# Patient Record
Sex: Female | Born: 1995
Health system: Southern US, Community
[De-identification: ages and names within clinical notes are randomized; demographics above are authoritative.]

## PROBLEM LIST (undated history)

## (undated) DIAGNOSIS — N83209 Unspecified ovarian cyst, unspecified side: Secondary | ICD-10-CM

## (undated) DIAGNOSIS — J45909 Unspecified asthma, uncomplicated: Secondary | ICD-10-CM

## (undated) HISTORY — PX: TUBAL LIGATION: SHX77

---
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
## 2016-02-24 ENCOUNTER — Ambulatory Visit (HOSPITAL_COMMUNITY)
Admission: EM | Admit: 2016-02-24 | Discharge: 2016-02-24 | Disposition: A | Discharge status: Home / Self Care | Payer: Medicaid Other | Attending: Emergency Medicine | Admitting: Emergency Medicine

## 2016-02-24 ENCOUNTER — Encounter (HOSPITAL_COMMUNITY): Payer: Self-pay | Admitting: Emergency Medicine

## 2016-02-24 DIAGNOSIS — R350 Frequency of micturition: Secondary | ICD-10-CM

## 2016-02-24 DIAGNOSIS — J4520 Mild intermittent asthma, uncomplicated: Secondary | ICD-10-CM

## 2016-02-24 DIAGNOSIS — L253 Unspecified contact dermatitis due to other chemical products: Secondary | ICD-10-CM | POA: Diagnosis not present

## 2016-02-24 DIAGNOSIS — J069 Acute upper respiratory infection, unspecified: Secondary | ICD-10-CM

## 2016-02-24 DIAGNOSIS — R112 Nausea with vomiting, unspecified: Secondary | ICD-10-CM

## 2016-02-24 LAB — POCT URINALYSIS DIP (DEVICE)
Bilirubin Urine: NEGATIVE
Glucose, UA: NEGATIVE mg/dL
Hgb urine dipstick: NEGATIVE
Ketones, ur: NEGATIVE mg/dL
Leukocytes, UA: NEGATIVE
Nitrite: NEGATIVE
Protein, ur: NEGATIVE mg/dL
Specific Gravity, Urine: 1.025 (ref 1.005–1.030)
Urobilinogen, UA: 0.2 mg/dL (ref 0.0–1.0)
pH: 5.5 (ref 5.0–8.0)

## 2016-02-24 MED ORDER — ONDANSETRON HCL 4 MG PO TABS: 4.0000 mg | ORAL_TABLET | Freq: Four times a day (QID) | ORAL | 0 refills | Status: DC

## 2016-02-24 MED ORDER — ALBUTEROL SULFATE HFA 108 (90 BASE) MCG/ACT IN AERS: 2.0000 | INHALATION_SPRAY | RESPIRATORY_TRACT | 0 refills | Status: DC | PRN

## 2016-02-24 MED ORDER — TRIAMCINOLONE ACETONIDE 0.1 % EX CREA: 1.0000 "application " | TOPICAL_CREAM | Freq: Two times a day (BID) | CUTANEOUS | 0 refills | Status: DC

## 2016-02-24 NOTE — Discharge Instructions (Signed)
Read the instructions above regarding care for your medical concerns today. Recommend taking Sudafed PE 10 mg every 4 hours for congestion and Allegra daily for drainage. Apply the triamcinolone cream to your thumb twice a day and wear close when cleaning. Your urinalysis was normal, no signs of infection blood or sugar in your urine. You may need to follow-up with primary care provider if this persists. During the time of nausea and vomiting recommended clear liquids in small frequent amounts and slowly advance diet as tolerated. Your provided a medicine called Zofran to help with nausea and vomiting.

## 2016-02-24 NOTE — ED Triage Notes (Signed)
[facility] a complaint of N/V x 3 days. The patient stated that her job requested she come and be evaluated and provided a work note to return to work.

## 2016-02-24 NOTE — ED Provider Notes (Signed)
CSN: [id number]     Arrival date & time 02/24/16  1449 History   First MD Initiated Contact with Patient 02/24/16 1629     Chief Complaint  Patient presents with  . Emesis   (Consider location/radiation/quality/duration/timing/severity/associated sxs/prior Treatment) 20 year old female presents to the urgent care with multiple complaints. She starts out by stating that she was sent to the urgent care by her employer because she has vomited twice today. Denies abdominal pain. Does have nausea and decreased appetite. In addition she has upper respiratory congestion, runny nose, PND, sore throat, headache. She states that 4 days ago she had a fever for one day but that seemed to dissipated and did not return. She is complaining of a rash to the thumb for one week it itches and burns. 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 History of asthma but she has not had it evaluated recently. She believes that she may be having an exacerbation but does not have medications are inhalers for treatment.        Past Medical History:  Diagnosis Date  . Asthma   . Ovarian cyst    Past Surgical History:  Procedure Laterality Date  . TUBAL LIGATION Right    To fix an ovarian cyst   History reviewed. No pertinent family history. Social History  Substance Use Topics  . Smoking status: Never Smoker  . Smokeless tobacco: Never Used  . Alcohol use No   OB History    No data available     Review of Systems  Constitutional: Negative.  Negative for activity change, appetite change, chills, fatigue and fever.  HENT: Positive for congestion, postnasal drip, rhinorrhea and sore throat. Negative for facial swelling.   Eyes: Negative.   Respiratory: Positive for  cough.   Cardiovascular: Negative.  Negative for chest pain and palpitations.  Gastrointestinal: Positive for nausea and vomiting.  Genitourinary: Positive for frequency.  Musculoskeletal: Negative.  Negative for neck pain and neck stiffness.  Skin: Negative for pallor and rash.       Redness to right thumb as per history of present illness.  Neurological: Negative.   All other systems reviewed and are negative.   Allergies  Patient has no known allergies.  Home Medications   Prior to Admission medications   Medication Sig Start Date End Date Taking? Authorizing Provider  acetaminophen (TYLENOL) 325 MG tablet Take 650 mg by mouth every 6 (six) hours as needed for mild pain or moderate pain.   Yes Historical Provider, MD  ondansetron (ZOFRAN) 4 MG tablet Take 1 tablet (4 mg total) by mouth every 6 (six) hours. 02/24/16   Hayden Rasmussenavid Jull Harral, NP  triamcinolone cream (KENALOG) 0.1 % Apply 1 application topically 2 (two) times daily. 02/24/16   Hayden Rasmussenavid Timithy Arons, NP   Meds Ordered and Administered this Visit  Medications - No data to display  BP (!) 148/54 (BP Location: Left Arm)   Pulse 84   Temp 98.2 F (36.8 C) (Oral)   Resp 18   SpO2 100%  No data found.   Physical Exam  Constitutional: She is oriented to person, place, and time. She appears well-developed and well-nourished. No distress.  HENT:  Right Ear: External ear normal.  Left Ear: External ear  normal.  Mouth/Throat: No oropharyngeal exudate.  Oropharynx with much cobblestoning, minor erythema and mild clear PND. No exudates or swelling. Left palatine tonsil slightly larger than left but otherwise both are relatively small.  Bilateral TMs are normal.  Eyes: EOM are normal.  Neck: Normal range of motion. Neck supple.  Cardiovascular: Normal rate, regular rhythm and normal heart sounds.   Pulmonary/Chest: Effort normal and breath sounds normal. No respiratory distress.  Normal inspiration. Diminished sounds on expiration. No  wheezes auscultated.  Abdominal: Soft. There is no tenderness.  Musculoskeletal: Normal range of motion. She exhibits no edema.  Lymphadenopathy:    She has no cervical adenopathy.  Neurological: She is alert and oriented to person, place, and time.  Skin: Skin is warm and dry.  There is a small area of  erythema to the extensor surface at the base of the right thumb. Evidence of scratching. No other evidence of erythema or areas that are affected. No other areas of erythema, recent scratching papules, vesicles or other lesions.   Nursing note and vitals reviewed.   Urgent Care Course   Clinical Course     Procedures (including critical care time)  Labs Review Labs Reviewed  POCT URINALYSIS DIP (DEVICE)    Imaging Review No results found.   Visual Acuity Review  Right Eye Distance:   Left Eye Distance:   Bilateral Distance:    Right Eye Near:   Left Eye Near:    Bilateral Near:         MDM   1. Non-intractable vomiting with nausea, unspecified vomiting type   2. Chemical dermatitis   3. Urinary frequency   4. Upper respiratory tract infection, unspecified type    Read the instructions above regarding care for your medical concerns today. Recommend taking Sudafed PE 10 mg every 4 hours for congestion and Allegra daily for drainage. Apply the triamcinolone cream to your thumb twice a day and wear close when cleaning. Your urinalysis was normal, no signs of infection blood or sugar in your urine. You may need to follow-up with primary care provider if this persists. During the time of nausea and vomiting recommended clear liquids in small frequent amounts and slowly advance diet as tolerated. Your provided a medicine called Zofran to help with nausea and vomiting. Meds ordered this encounter  Medications  . ondansetron (ZOFRAN) 4 MG tablet    Sig: Take 1 tablet (4 mg total) by mouth every 6 (six) hours.    Dispense:  12 tablet    Refill:  0    Order Specific  Question:   Supervising Provider    Answer:   Charm RingsHONIG, ERIN J [id number]]  . triamcinolone cream (KENALOG) 0.1 %    Sig: Apply 1 application topically 2 (two) times daily.    Dispense:  15 g    Refill:  0    Order Specific Question:   Supervising Provider    Answer:   Charm RingsHONIG, ERIN J [id number]]  . albuterol (PROVENTIL HFA;VENTOLIN HFA) 108 (90 Base) MCG/ACT inhaler    Sig: Inhale 2 puffs into the lungs every 4 (four) hours as needed for wheezing or shortness of breath.    Dispense:  1 Inhaler    Refill:  0    Order Specific Question:   Supervising Provider    Answer:   Charm RingsHONIG, ERIN J [id number]]       Hayden Rasmussenavid Cassundra Mckeever, NP 02/24/16 1726    Hayden Rasmussenavid Ileane Sando, NP 02/24/16 1730

## 2016-04-29 ENCOUNTER — Encounter (HOSPITAL_COMMUNITY): Payer: Self-pay | Admitting: Emergency Medicine

## 2016-04-29 ENCOUNTER — Emergency Department (HOSPITAL_COMMUNITY)
Admission: EM | Admit: 2016-04-29 | Discharge: 2016-04-29 | Disposition: A | Discharge status: Home / Self Care | Payer: Medicaid Other | Attending: Emergency Medicine | Admitting: Emergency Medicine

## 2016-04-29 DIAGNOSIS — J45909 Unspecified asthma, uncomplicated: Secondary | ICD-10-CM | POA: Diagnosis not present

## 2016-04-29 DIAGNOSIS — Z79899 Other long term (current) drug therapy: Secondary | ICD-10-CM | POA: Diagnosis not present

## 2016-04-29 DIAGNOSIS — T7840XA Allergy, unspecified, initial encounter: Secondary | ICD-10-CM | POA: Diagnosis not present

## 2016-04-29 LAB — CBC WITH DIFFERENTIAL/PLATELET
Basophils Absolute: 0 10*3/uL (ref 0.0–0.1)
Basophils Relative: 1 %
Eosinophils Absolute: 0 10*3/uL (ref 0.0–0.7)
Eosinophils Relative: 0 %
HCT: 35.4 % — ABNORMAL LOW (ref 36.0–46.0)
Hemoglobin: 11.6 g/dL — ABNORMAL LOW (ref 12.0–15.0)
Lymphocytes Relative: 31 %
Lymphs Abs: 1.6 10*3/uL (ref 0.7–4.0)
MCH: 26.2 pg (ref 26.0–34.0)
MCHC: 32.8 g/dL (ref 30.0–36.0)
MCV: 80.1 fL (ref 78.0–100.0)
Monocytes Absolute: 0.8 10*3/uL (ref 0.1–1.0)
Monocytes Relative: 16 %
Neutro Abs: 2.7 10*3/uL (ref 1.7–7.7)
Neutrophils Relative %: 52 %
Platelets: 249 10*3/uL (ref 150–400)
RBC: 4.42 MIL/uL (ref 3.87–5.11)
RDW: 12.8 % (ref 11.5–15.5)
WBC: 5.1 10*3/uL (ref 4.0–10.5)

## 2016-04-29 LAB — BASIC METABOLIC PANEL
Anion gap: 8 (ref 5–15)
BUN: 11 mg/dL (ref 6–20)
CO2: 23 mmol/L (ref 22–32)
Calcium: 8.5 mg/dL — ABNORMAL LOW (ref 8.9–10.3)
Chloride: 105 mmol/L (ref 101–111)
Creatinine, Ser: 0.74 mg/dL (ref 0.44–1.00)
GFR calc Af Amer: >60 mL/min (ref >60)
GFR calc non Af Amer: >60 mL/min (ref >60)
Glucose, Bld: 86 mg/dL (ref 65–99)
Potassium: 3.4 mmol/L — ABNORMAL LOW (ref 3.5–5.1)
Sodium: 136 mmol/L (ref 135–145)

## 2016-04-29 MED ORDER — MAGIC MOUTHWASH
10.0000 mL | Freq: Once | ORAL | Status: AC
  Administered 2016-04-29: 10 mL via ORAL
  Filled 2016-04-29: qty 10

## 2016-04-29 MED ORDER — PREDNISONE 20 MG PO TABS: 40.0000 mg | ORAL_TABLET | Freq: Every day | ORAL | 0 refills | Status: AC

## 2016-04-29 MED ORDER — METHYLPREDNISOLONE SODIUM SUCC 125 MG IJ SOLR
125.0000 mg | Freq: Once | INTRAMUSCULAR | Status: AC
  Administered 2016-04-29: 125 mg via INTRAVENOUS
  Filled 2016-04-29: qty 2

## 2016-04-29 MED ORDER — RANITIDINE HCL 150 MG PO CAPS: 150.0000 mg | ORAL_CAPSULE | Freq: Every day | ORAL | 0 refills | Status: DC

## 2016-04-29 MED ORDER — DIPHENHYDRAMINE HCL 50 MG/ML IJ SOLN
25.0000 mg | Freq: Once | INTRAMUSCULAR | Status: AC
  Administered 2016-04-29: 25 mg via INTRAVENOUS
  Filled 2016-04-29: qty 1

## 2016-04-29 MED ORDER — DIPHENHYDRAMINE HCL 25 MG PO TABS: 25.0000 mg | ORAL_TABLET | Freq: Four times a day (QID) | ORAL | 0 refills | Status: DC

## 2016-04-29 NOTE — ED Triage Notes (Signed)
Pt c/o throat, gingiva, and tongue swelling, dysphagia, SOB onset Monday, slowly progressing, no sudden progression of symptoms. Did use different brand of mouth wash, Equate brand. In control of oral secretions, no acute distress.

## 2016-04-29 NOTE — ED Provider Notes (Signed)
[facility]-EMERGENCY DEPT Provider Note   CSN: [id number] Arrival date & time: 04/29/16  1117     History   Chief Complaint Chief Complaint  Patient presents with  . Oral Swelling  . Shortness of Breath    HPI Ariel Lutz is a 21 y.o. female.  HPI   21 year old female with no sniffing a past medical history presents with tongue and lip swelling for the past 3 days. Patient states that 3 days ago, she used a new generic brand mouthwash. Since then, she has had persistent lip and tongue swelling. She feels a fullness in her mouth but denies significant difficulty breathing. She has been able to swallow. No fevers. She has had associated tingling and burning of her lips. No history of previous reactions. No rash. No other new lipstick or foods. No other new allergen exposures. No dental pain. No fevers or chills. No neck pain or stiffness.  Past Medical History:  Diagnosis Date  . Asthma   . Ovarian cyst     There are no active problems to display for this patient.   Past Surgical History:  Procedure Laterality Date  . TUBAL LIGATION Right    To fix an ovarian cyst    OB History    No data available       Home Medications    Prior to Admission medications   Medication Sig Start Date End Date Taking? Authorizing Provider  albuterol (PROVENTIL HFA;VENTOLIN HFA) 108 (90 Base) MCG/ACT inhaler Inhale 2 puffs into the lungs every 4 (four) hours as needed for wheezing or shortness of breath. 02/24/16  Yes Hayden Rasmussen, NP  ibuprofen (ADVIL,MOTRIN) 200 MG tablet Take 200-400 mg by mouth every 6 (six) hours as needed for headache or mild pain.   Yes Historical Provider, MD  acetaminophen (TYLENOL) 325 MG tablet Take 650 mg by mouth every 6 (six) hours as needed for mild pain or moderate pain.    Historical Provider, MD  diphenhydrAMINE (BENADRYL) 25 MG tablet Take 1 tablet (25 mg total) by mouth every 6 (six) hours. 04/29/16 05/04/16  Shaune Pollack, MD  ondansetron (ZOFRAN) 4 MG tablet  Take 1 tablet (4 mg total) by mouth every 6 (six) hours. Patient not taking: Reported on 04/29/2016 02/24/16   Hayden Rasmussen, NP  predniSONE (DELTASONE) 20 MG tablet Take 2 tablets (40 mg total) by mouth daily. 04/29/16 05/04/16  Shaune Pollack, MD  ranitidine (ZANTAC) 150 MG capsule Take 1 capsule (150 mg total) by mouth daily. 04/29/16 05/04/16  Shaune Pollack, MD  triamcinolone cream (KENALOG) 0.1 % Apply 1 application topically 2 (two) times daily. Patient not taking: Reported on 04/29/2016 02/24/16   Hayden Rasmussen, NP    Family History History reviewed. No pertinent family history.  Social History Social History  Substance Use Topics  . Smoking status: Never Smoker  . Smokeless tobacco: Never Used  . Alcohol use No     Allergies   Patient has no known allergies.   Review of Systems Review of Systems  Constitutional: Positive for fatigue. Negative for fever.  HENT: Positive for dental problem and facial swelling.   Respiratory: Negative for cough and shortness of breath.   Cardiovascular: Negative for chest pain.  Gastrointestinal: Negative for abdominal pain and diarrhea.  Genitourinary: Negative for dysuria and flank pain.  Musculoskeletal: Negative for neck pain.  Skin: Negative for rash.  Neurological: Negative for weakness and headaches.     Physical Exam Updated Vital Signs BP 128/67 (BP Location: Left Arm)  Pulse 92   Temp 98.4 F (36.9 C) (Oral)   Resp 16   SpO2 100%   Physical Exam  Constitutional: She is oriented to person, place, and time. She appears well-developed and well-nourished. No distress.  HENT:  Head: Normocephalic and atraumatic.  Mild subjective swelling of the lips and tongue. Oropharynx is otherwise widely patent with Mallampati class II. There is moderate gingival erythema diffusely without significant hyperplasia. No active dental caries.  Eyes: Conjunctivae are normal.  Neck: Neck supple.  Cardiovascular: Normal rate, regular rhythm and normal  heart sounds.  Exam reveals no friction rub.   No murmur heard. Pulmonary/Chest: Effort normal and breath sounds normal. No respiratory distress. She has no wheezes. She has no rales.  Abdominal: She exhibits no distension.  Musculoskeletal: She exhibits no edema.  Neurological: She is alert and oriented to person, place, and time. She exhibits normal muscle tone.  Skin: Skin is warm. Capillary refill takes less than 2 seconds.  Psychiatric: She has a normal mood and affect.  Nursing note and vitals reviewed.    ED Treatments / Results  Labs (all labs ordered are listed, but only abnormal results are displayed) Labs Reviewed  CBC WITH DIFFERENTIAL/PLATELET - Abnormal; Notable for the following:       Result Value   Hemoglobin 11.6 (*)    HCT 35.4 (*)    All other components within normal limits  BASIC METABOLIC PANEL - Abnormal; Notable for the following:    Potassium 3.4 (*)    Calcium 8.5 (*)    All other components within normal limits    EKG  EKG Interpretation None       Radiology No results found.  Procedures Procedures (including critical care time)  Medications Ordered in ED Medications  diphenhydrAMINE (BENADRYL) injection 25 mg (25 mg Intravenous Given 04/29/16 1153)  methylPREDNISolone sodium succinate (SOLU-MEDROL) 125 mg/2 mL injection 125 mg (125 mg Intravenous Given 04/29/16 1153)  magic mouthwash (10 mLs Oral Given 04/29/16 1328)     Initial Impression / Assessment and Plan / ED Course  I have reviewed the triage vital signs and the nursing notes.  Pertinent labs & imaging results that were available during my care of the patient were reviewed by me and considered in my medical decision making (see chart for details).    21 yo F with PMHx as above here with mild, subjective lip/gingival swelling after using mouthwash 2-3 days ago. No rash, wheezing, abdominal pain, or other sx of anaphylaxis. Suspect mild irritant versus allergic mucositis 2/2 mouthwash  use. No signs of airway compromise. Labs unremarkable. Sx stable and improving after treatment in ED. Will advise her to stop use of her mouthwash, d/c with outpt f/u. No evidence of dental infection or abscess. No sublingual swelling or signs of Ludwig's or deep neck infection.  Final Clinical Impressions(s) / ED Diagnoses   Final diagnoses:  Allergic reaction, initial encounter    New Prescriptions Discharge Medication List as of 04/29/2016  1:54 PM    START taking these medications   Details  diphenhydrAMINE (BENADRYL) 25 MG tablet Take 1 tablet (25 mg total) by mouth every 6 (six) hours., Starting Thu 04/29/2016, Until Tue 05/04/2016, Print    predniSONE (DELTASONE) 20 MG tablet Take 2 tablets (40 mg total) by mouth daily., Starting Thu 04/29/2016, Until Tue 05/04/2016, Print    ranitidine (ZANTAC) 150 MG capsule Take 1 capsule (150 mg total) by mouth daily., Starting Thu 04/29/2016, Until Tue 05/04/2016, Print  Shaune Pollackameron Ajanae Virag, MD 04/29/16 [phone]

## 2016-04-29 NOTE — ED Notes (Signed)
RN unable to get blood with IV stick. Will let phlebotomy know about drawing specimens.

## 2016-04-29 NOTE — ED Notes (Signed)
ED Provider at bedside. 

## 2016-10-18 ENCOUNTER — Encounter (HOSPITAL_COMMUNITY): Payer: Self-pay | Admitting: Emergency Medicine

## 2016-10-18 DIAGNOSIS — S20469A Insect bite (nonvenomous) of unspecified back wall of thorax, initial encounter: Secondary | ICD-10-CM | POA: Diagnosis not present

## 2016-10-18 DIAGNOSIS — W57XXXA Bitten or stung by nonvenomous insect and other nonvenomous arthropods, initial encounter: Secondary | ICD-10-CM | POA: Diagnosis not present

## 2016-10-18 DIAGNOSIS — Y929 Unspecified place or not applicable: Secondary | ICD-10-CM | POA: Insufficient documentation

## 2016-10-18 DIAGNOSIS — Z79899 Other long term (current) drug therapy: Secondary | ICD-10-CM | POA: Insufficient documentation

## 2016-10-18 DIAGNOSIS — J45909 Unspecified asthma, uncomplicated: Secondary | ICD-10-CM | POA: Insufficient documentation

## 2016-10-18 DIAGNOSIS — Y999 Unspecified external cause status: Secondary | ICD-10-CM | POA: Diagnosis not present

## 2016-10-18 DIAGNOSIS — Y939 Activity, unspecified: Secondary | ICD-10-CM | POA: Diagnosis not present

## 2016-10-18 NOTE — ED Triage Notes (Signed)
Pt reports she was outside when she felt something bite her on her lower back. Pt went to take a nap but still feels pain upon waking. Reports itching.

## 2016-10-19 ENCOUNTER — Emergency Department (HOSPITAL_COMMUNITY)
Admission: EM | Admit: 2016-10-19 | Discharge: 2016-10-19 | Disposition: A | Discharge status: Home / Self Care | Payer: Medicaid Other | Attending: Emergency Medicine | Admitting: Emergency Medicine

## 2016-10-19 DIAGNOSIS — W57XXXA Bitten or stung by nonvenomous insect and other nonvenomous arthropods, initial encounter: Secondary | ICD-10-CM

## 2016-10-19 MED ORDER — DIPHENHYDRAMINE HCL 25 MG PO CAPS
25.0000 mg | ORAL_CAPSULE | Freq: Once | ORAL | Status: AC
  Administered 2016-10-19: 25 mg via ORAL
  Filled 2016-10-19: qty 1

## 2016-10-19 NOTE — Discharge Instructions (Signed)
Can continue benadryl every 6-8 hours as needed for itching. Follow-up with your primary care doctor. Return here for new concerns.

## 2016-10-19 NOTE — ED Provider Notes (Signed)
[facility]-EMERGENCY DEPT Provider Note   CSN: [id number] Arrival date & time: 10/18/16  2222     History   Chief Complaint Chief Complaint  Patient presents with  . Insect Bite    HPI Ariel Lutz is a 21 y.o. female.  The history is provided by the patient and medical records.    21 y.o. F with hx of asthma, ovarian cysts, presenting to the ED for a bug bite.  States she was at a family cookout last evening when she was bitten by something on her back.  Never saw what it was but felt a "bump" on her back afterwards.  States she has been intermittently itching since then.  No known allergies to insects.  No rashes.  No fever.  Past Medical History:  Diagnosis Date  . Asthma   . Ovarian cyst     There are no active problems to display for this patient.   Past Surgical History:  Procedure Laterality Date  . TUBAL LIGATION Right    To fix an ovarian cyst    OB History    No data available       Home Medications    Prior to Admission medications   Medication Sig Start Date End Date Taking? Authorizing Provider  acetaminophen (TYLENOL) 325 MG tablet Take 650 mg by mouth every 6 (six) hours as needed for mild pain or moderate pain.    [provider]  albuterol (PROVENTIL HFA;VENTOLIN HFA) 108 (90 Base) MCG/ACT inhaler Inhale 2 puffs into the lungs every 4 (four) hours as needed for wheezing or shortness of breath. 02/24/16   Hayden Rasmussen, NP  diphenhydrAMINE (BENADRYL) 25 MG tablet Take 1 tablet (25 mg total) by mouth every 6 (six) hours. 04/29/16 05/04/16  Shaune Pollack, MD  ibuprofen (ADVIL,MOTRIN) 200 MG tablet Take 200-400 mg by mouth every 6 (six) hours as needed for headache or mild pain.    [provider]  ondansetron (ZOFRAN) 4 MG tablet Take 1 tablet (4 mg total) by mouth every 6 (six) hours. Patient not taking: Reported on 04/29/2016 02/24/16   Hayden Rasmussen, NP  ranitidine (ZANTAC) 150 MG capsule Take 1 capsule (150 mg total) by mouth daily. 04/29/16  05/04/16  Shaune Pollack, MD  triamcinolone cream (KENALOG) 0.1 % Apply 1 application topically 2 (two) times daily. Patient not taking: Reported on 04/29/2016 02/24/16   Hayden Rasmussen, NP    Family History No family history on file.  Social History Social History  Substance Use Topics  . Smoking status: Never Smoker  . Smokeless tobacco: Never Used  . Alcohol use No     Allergies   Patient has no known allergies.   Review of Systems Review of Systems  Skin:       Bug bite  All other systems reviewed and are negative.    Physical Exam Updated Vital Signs BP 124/81 (BP Location: Left Arm)   Pulse (!) 124   Temp 98.6 F (37 C) (Oral)   Resp 18   Ht 5\' 7"  (1.702 m)   Wt 113.4 kg (250 lb)   SpO2 97%   BMI 39.16 kg/m   Physical Exam  Constitutional: She is oriented to person, place, and time. She appears well-developed and well-nourished.  HENT:  Head: Normocephalic and atraumatic.  Mouth/Throat: Oropharynx is clear and moist.  No oral lesions or airway compromise, handling secretions well, normal phonation without stridor  Eyes: Pupils are equal, round, and reactive to light. Conjunctivae and EOM are  normal.  Neck: Normal range of motion.  Cardiovascular: Normal rate, regular rhythm and normal heart sounds.   Pulmonary/Chest: Effort normal and breath sounds normal.  Abdominal: Soft. Bowel sounds are normal.  Musculoskeletal: Normal range of motion.  Neurological: She is alert and oriented to person, place, and time.  Skin: Skin is warm and dry.  Apparent bug bite noted to central lower back; no surrounding redness or erythema, no induration or signs of cellulitis; no rashes  Psychiatric: She has a normal mood and affect.  Nursing note and vitals reviewed.    ED Treatments / Results  Labs (all labs ordered are listed, but only abnormal results are displayed) Labs Reviewed - No data to display  EKG  EKG Interpretation None       Radiology No results  found.  Procedures Procedures (including critical care time)  Medications Ordered in ED Medications  diphenhydrAMINE (BENADRYL) capsule 25 mg (25 mg Oral Given 10/19/16 0314)     Initial Impression / Assessment and Plan / ED Course  I have reviewed the triage vital signs and the nursing notes.  Pertinent labs & imaging results that were available during my care of the patient were reviewed by me and considered in my medical decision making (see chart for details).  21 year old female here with insect bite. On exam she has apparent insect bite to the central lower back.  No complicating features such as superimposed infection, airway compromise, or rash. Have asking use Benadryl to help with itching. Patient appears stable for discharge home. Return precautions given for any new or worsening symptoms.  Final Clinical Impressions(s) / ED Diagnoses   Final diagnoses:  Insect bite, initial encounter    New Prescriptions New Prescriptions   No medications on file     Garlon Hatchet, PA-C 10/19/16 0319    Ward, Layla Maw, DO 10/19/16 [phone]

## 2017-01-12 ENCOUNTER — Emergency Department (HOSPITAL_COMMUNITY): Payer: Medicaid Other

## 2017-01-12 ENCOUNTER — Encounter (HOSPITAL_COMMUNITY): Payer: Self-pay | Admitting: *Deleted

## 2017-01-12 ENCOUNTER — Emergency Department (HOSPITAL_COMMUNITY)
Admission: EM | Admit: 2017-01-12 | Discharge: 2017-01-12 | Disposition: A | Discharge status: Home / Self Care | Payer: Medicaid Other | Attending: Emergency Medicine | Admitting: Emergency Medicine

## 2017-01-12 DIAGNOSIS — L309 Dermatitis, unspecified: Secondary | ICD-10-CM

## 2017-01-12 DIAGNOSIS — M545 Low back pain, unspecified: Secondary | ICD-10-CM

## 2017-01-12 DIAGNOSIS — Z79899 Other long term (current) drug therapy: Secondary | ICD-10-CM | POA: Insufficient documentation

## 2017-01-12 DIAGNOSIS — J45909 Unspecified asthma, uncomplicated: Secondary | ICD-10-CM | POA: Insufficient documentation

## 2017-01-12 DIAGNOSIS — M549 Dorsalgia, unspecified: Secondary | ICD-10-CM | POA: Diagnosis present

## 2017-01-12 LAB — POC URINE PREG, ED: Preg Test, Ur: NEGATIVE

## 2017-01-12 MED ORDER — TRIAMCINOLONE ACETONIDE 0.1 % EX CREA: 1.0000 "application " | TOPICAL_CREAM | Freq: Two times a day (BID) | CUTANEOUS | 0 refills | Status: DC

## 2017-01-12 MED ORDER — METHOCARBAMOL 500 MG PO TABS: 500.0000 mg | ORAL_TABLET | Freq: Every evening | ORAL | 0 refills | Status: DC | PRN

## 2017-01-12 MED ORDER — NAPROXEN 500 MG PO TABS: 500.0000 mg | ORAL_TABLET | Freq: Two times a day (BID) | ORAL | 0 refills | Status: DC

## 2017-01-12 MED ORDER — IBUPROFEN 800 MG PO TABS
800.0000 mg | ORAL_TABLET | Freq: Once | ORAL | Status: AC
  Administered 2017-01-12: 800 mg via ORAL
  Filled 2017-01-12: qty 1

## 2017-01-12 NOTE — ED Triage Notes (Signed)
Pt complains of mid back pain since a 15-20lb crate fell on her back this morning. Pain is worse when trying to straighten back.

## 2017-01-12 NOTE — ED Provider Notes (Signed)
[facility]-EMERGENCY DEPT Provider Note   CSN: [id number] Arrival date & time: 01/12/17  1651     History   Chief Complaint Chief Complaint  Patient presents with  . Back Pain    HPI Ariel FerrisLeah Lutz is a 21 y.o. female with no significant past medical history presenting with back pain after a crate fell onto her back at work around 2 AM this morning. Her pain is aggravated by movement and relieved by sitting still. She has tried Tylenol without relief. Denies any lower extremity numbness, loss of bowel or bladder function or other symptoms.  HPI  Past Medical History:  Diagnosis Date  . Asthma   . Ovarian cyst     There are no active problems to display for this patient.   Past Surgical History:  Procedure Laterality Date  . TUBAL LIGATION Right    To fix an ovarian cyst    OB History    No data available       Home Medications    Prior to Admission medications   Medication Sig Start Date End Date Taking? Authorizing Provider  acetaminophen (TYLENOL) 325 MG tablet Take 650 mg by mouth every 6 (six) hours as needed for mild pain or moderate pain.    [provider]  albuterol (PROVENTIL HFA;VENTOLIN HFA) 108 (90 Base) MCG/ACT inhaler Inhale 2 puffs into the lungs every 4 (four) hours as needed for wheezing or shortness of breath. 02/24/16   Hayden RasmussenMabe, David, NP  diphenhydrAMINE (BENADRYL) 25 MG tablet Take 1 tablet (25 mg total) by mouth every 6 (six) hours. 04/29/16 05/04/16  Shaune PollackIsaacs, Cameron, MD  ibuprofen (ADVIL,MOTRIN) 200 MG tablet Take 200-400 mg by mouth every 6 (six) hours as needed for headache or mild pain.    [provider]  methocarbamol (ROBAXIN) 500 MG tablet Take 1 tablet (500 mg total) at bedtime as needed by mouth for muscle spasms. 01/12/17   Mathews RobinsonsMitchell, Shatia Sindoni B, PA-C  naproxen (NAPROSYN) 500 MG tablet Take 1 tablet (500 mg total) 2 (two) times daily with a meal by mouth. 01/12/17   Mathews RobinsonsMitchell, Basilio Meadow B, PA-C    ondansetron (ZOFRAN) 4 MG tablet Take 1 tablet (4 mg total) by mouth every 6 (six) hours. Patient not taking: Reported on 04/29/2016 02/24/16   Hayden RasmussenMabe, David, NP  ranitidine (ZANTAC) 150 MG capsule Take 1 capsule (150 mg total) by mouth daily. 04/29/16 05/04/16  Shaune PollackIsaacs, Cameron, MD  triamcinolone cream (KENALOG) 0.1 % Apply 1 application topically 2 (two) times daily. Patient not taking: Reported on 04/29/2016 02/24/16   Hayden RasmussenMabe, David, NP    Family History No family history on file.  Social History Social History   Tobacco Use  . Smoking status: Never Smoker  . Smokeless tobacco: Never Used  Substance Use Topics  . Alcohol use: No  . Drug use: Not on file     Allergies   Patient has no known allergies.   Review of Systems Review of Systems  Gastrointestinal: Negative for nausea and vomiting.  Genitourinary: Negative for difficulty urinating.  Musculoskeletal: Positive for back pain and myalgias. Negative for arthralgias, gait problem, joint swelling, neck pain and neck stiffness.  Skin: Negative for color change, pallor, rash and wound.  Neurological: Negative for weakness and numbness.     Physical Exam Updated Vital Signs BP 105/68 (BP Location: Right Arm)   Pulse 64   Temp 98.7 F (37.1 C) (Oral)   Resp 14   LMP 12/12/2016   SpO2 99%  Physical Exam  Constitutional: She appears well-developed and well-nourished. No distress.  Afebrile, nontoxic-appearing, sitting comfortably in chair in no acute distress.  HENT:  Head: Normocephalic and atraumatic.  Eyes: Conjunctivae and EOM are normal.  Neck: Normal range of motion. Neck supple.  Cardiovascular: Normal rate, regular rhythm and normal heart sounds.  No murmur heard. Pulmonary/Chest: Effort normal and breath sounds normal. No respiratory distress. She has no wheezes.  Musculoskeletal: Normal range of motion. She exhibits tenderness. She exhibits no edema or deformity.  Midline tenderness palpation of the lumbar spine  from L1 through 5. No overlying edema, erythema, or abrasion.  Neurological: She is alert. No sensory deficit. She exhibits normal muscle tone.  5 out of 5 strength in lower extremities bilaterally. Patient is ambulatory with normal stance and gait. Neurovascularly intact  Skin: Skin is warm and dry. Capillary refill takes less than 2 seconds. No rash noted. She is not diaphoretic. No erythema. No pallor.  Psychiatric: She has a normal mood and affect.  Nursing note and vitals reviewed.    ED Treatments / Results  Labs (all labs ordered are listed, but only abnormal results are displayed) Labs Reviewed  POC URINE PREG, ED    EKG  EKG Interpretation None       Radiology Dg Lumbar Spine Complete  Result Date: 01/12/2017 CLINICAL DATA:  Fall with tenderness EXAM: LUMBAR SPINE - COMPLETE 4+ VIEW COMPARISON:  02/14/2015 FINDINGS: There is no evidence of lumbar spine fracture. Alignment is normal. Intervertebral disc spaces are maintained. IMPRESSION: Negative. Electronically Signed   By: Jasmine PangKim  Fujinaga M.D.   On: 01/12/2017 19:36    Procedures Procedures (including critical care time)  Medications Ordered in ED Medications  ibuprofen (ADVIL,MOTRIN) tablet 800 mg (800 mg Oral Given 01/12/17 1852)     Initial Impression / Assessment and Plan / ED Course  I have reviewed the triage vital signs and the nursing notes.  Pertinent labs & imaging results that were available during my care of the patient were reviewed by me and considered in my medical decision making (see chart for details).    Patient presenting with back pain following trauma from a crate falling from shelf directly onto her back work. Ambulating with normal stance and gait. NVI  Midline tenderness to palpation of the lumbar spine Plain films negative  Will discharge home with symptomatic relief and close follow-up with PCP if symptoms persist beyond a week.  Discussed strict return precautions and advised to  return to the emergency department if experiencing any new or worsening symptoms. Instructions were understood and patient agreed with discharge plan.  Final Clinical Impressions(s) / ED Diagnoses   Final diagnoses:  Acute midline low back pain without sciatica    ED Discharge Orders        Ordered    methocarbamol (ROBAXIN) 500 MG tablet  At bedtime PRN     01/12/17 2015    naproxen (NAPROSYN) 500 MG tablet  2 times daily with meals     01/12/17 2015       Georgiana ShoreMitchell, Greyson Peavy B, PA-C 01/12/17 2022    Derwood KaplanNanavati, Ankit, MD 01/13/17 1640

## 2017-01-12 NOTE — Discharge Instructions (Signed)
As discussed, you may experience muscle spasm from the injury. The medicine prescribed can help with muscle spasm but cannot be taken if driving, with alcohol or operating machinery.  Take naproxen twice a day with food.  Follow up with your Primary care provider if symptoms persist beyond a week.  Return if worsening or new concerning symptoms in the meantime.

## 2017-01-26 ENCOUNTER — Emergency Department (HOSPITAL_COMMUNITY): Payer: Medicaid Other

## 2017-01-26 ENCOUNTER — Encounter (HOSPITAL_COMMUNITY): Payer: Self-pay | Admitting: Family Medicine

## 2017-01-26 ENCOUNTER — Emergency Department (HOSPITAL_COMMUNITY)
Admission: EM | Admit: 2017-01-26 | Discharge: 2017-01-26 | Disposition: A | Discharge status: Home / Self Care | Payer: Medicaid Other | Attending: Emergency Medicine | Admitting: Emergency Medicine

## 2017-01-26 DIAGNOSIS — X58XXXA Exposure to other specified factors, initial encounter: Secondary | ICD-10-CM | POA: Insufficient documentation

## 2017-01-26 DIAGNOSIS — J45909 Unspecified asthma, uncomplicated: Secondary | ICD-10-CM | POA: Insufficient documentation

## 2017-01-26 DIAGNOSIS — S93402A Sprain of unspecified ligament of left ankle, initial encounter: Secondary | ICD-10-CM | POA: Diagnosis present

## 2017-01-26 DIAGNOSIS — Z79899 Other long term (current) drug therapy: Secondary | ICD-10-CM | POA: Insufficient documentation

## 2017-01-26 DIAGNOSIS — Y929 Unspecified place or not applicable: Secondary | ICD-10-CM | POA: Diagnosis not present

## 2017-01-26 DIAGNOSIS — Y990 Civilian activity done for income or pay: Secondary | ICD-10-CM | POA: Diagnosis not present

## 2017-01-26 DIAGNOSIS — IMO0001 Reserved for inherently not codable concepts without codable children: Secondary | ICD-10-CM

## 2017-01-26 DIAGNOSIS — Y939 Activity, unspecified: Secondary | ICD-10-CM | POA: Insufficient documentation

## 2017-01-26 NOTE — ED Triage Notes (Signed)
Patient is complaining of left ankle pain and swelling. Patient reports she has been working more, on her feet, and twisted while going down the steps yesterday at work. Patient is ambulatory with a steady gait.

## 2017-01-28 NOTE — ED Provider Notes (Signed)
[facility]-EMERGENCY DEPT Provider Note   CSN: [id number] Arrival date & time: 01/26/17  1646     History   Chief Complaint Chief Complaint  Patient presents with  . Joint Swelling    HPI Ariel Lutz is a 21 y.o. female.  The history is provided by the patient. No language interpreter was used.  Ankle Pain   The incident occurred yesterday. The incident occurred at work. The injury mechanism was torsion. The quality of the pain is described as aching. The pain is moderate. The pain has been constant since onset. Associated symptoms include loss of motion. She reports no foreign bodies present. She has tried nothing for the symptoms. The treatment provided no relief.    Past Medical History:  Diagnosis Date  . Asthma   . Ovarian cyst     There are no active problems to display for this patient.   Past Surgical History:  Procedure Laterality Date  . TUBAL LIGATION Right    To fix an ovarian cyst    OB History    No data available       Home Medications    Prior to Admission medications   Medication Sig Start Date End Date Taking? Authorizing Provider  acetaminophen (TYLENOL) 325 MG tablet Take 650 mg by mouth every 6 (six) hours as needed for mild pain or moderate pain.    [provider]  albuterol (PROVENTIL HFA;VENTOLIN HFA) 108 (90 Base) MCG/ACT inhaler Inhale 2 puffs into the lungs every 4 (four) hours as needed for wheezing or shortness of breath. 02/24/16   Hayden RasmussenMabe, David, NP  diphenhydrAMINE (BENADRYL) 25 MG tablet Take 1 tablet (25 mg total) by mouth every 6 (six) hours. 04/29/16 05/04/16  Shaune PollackIsaacs, Cameron, MD  ibuprofen (ADVIL,MOTRIN) 200 MG tablet Take 200-400 mg by mouth every 6 (six) hours as needed for headache or mild pain.    [provider]  methocarbamol (ROBAXIN) 500 MG tablet Take 1 tablet (500 mg total) at bedtime as needed by mouth for muscle spasms. 01/12/17   Mathews RobinsonsMitchell, Jessica B, PA-C  naproxen (NAPROSYN)  500 MG tablet Take 1 tablet (500 mg total) 2 (two) times daily with a meal by mouth. 01/12/17   Mathews RobinsonsMitchell, Jessica B, PA-C  ondansetron (ZOFRAN) 4 MG tablet Take 1 tablet (4 mg total) by mouth every 6 (six) hours. Patient not taking: Reported on 04/29/2016 02/24/16   Hayden RasmussenMabe, David, NP  ranitidine (ZANTAC) 150 MG capsule Take 1 capsule (150 mg total) by mouth daily. 04/29/16 05/04/16  Shaune PollackIsaacs, Cameron, MD  triamcinolone cream (KENALOG) 0.1 % Apply 1 application 2 (two) times daily topically. 01/12/17   Georgiana ShoreMitchell, Jessica B, PA-C    Family History History reviewed. No pertinent family history.  Social History Social History   Tobacco Use  . Smoking status: Never Smoker  . Smokeless tobacco: Never Used  Substance Use Topics  . Alcohol use: No  . Drug use: No     Allergies   Patient has no known allergies.   Review of Systems Review of Systems  All other systems reviewed and are negative.    Physical Exam Updated Vital Signs BP 127/79 (BP Location: Right Arm)   Pulse 71   Temp 98.2 F (36.8 C) (Oral)   Resp 18   Ht 5\' 7"  (1.702 m)   Wt 89.8 kg (198 lb)   LMP 01/05/2017 Comment: "Sometime last month"  SpO2 100%   BMI 31.01 kg/m   Physical Exam  Constitutional: She is  oriented to person, place, and time. She appears well-developed and well-nourished.  HENT:  Head: Normocephalic.  Eyes: EOM are normal.  Neck: Normal range of motion.  Pulmonary/Chest: Effort normal.  Abdominal: She exhibits no distension.  Musculoskeletal: Normal range of motion.  Tender left ankle,  No deformity nv and ns intact  Neurological: She is alert and oriented to person, place, and time.  Psychiatric: She has a normal mood and affect.  Nursing note and vitals reviewed.    ED Treatments / Results  Labs (all labs ordered are listed, but only abnormal results are displayed) Labs Reviewed - No data to display  EKG  EKG Interpretation None       Radiology Dg Ankle Complete Left  Result  Date: 01/26/2017 CLINICAL DATA:  21 y/o  F; twisting injury with pain. EXAM: LEFT ANKLE COMPLETE - 3+ VIEW COMPARISON:  None. FINDINGS: There is no evidence of fracture, dislocation, or joint effusion. There is no evidence of arthropathy or other focal bone abnormality. Talar dome is intact. Ankle mortise is symmetric on these nonstress views. IMPRESSION: Negative. Electronically Signed   By: Mitzi HansenLance  Furusawa-Stratton M.D.   On: 01/26/2017 18:05    Procedures Procedures (including critical care time)  Medications Ordered in ED Medications - No data to display   Initial Impression / Assessment and Plan / ED Course  I have reviewed the triage vital signs and the nursing notes.  Pertinent labs & imaging results that were available during my care of the patient were reviewed by me and considered in my medical decision making (see chart for details).     Pt placed in aso and given crutches.   Pt advised to follow up with Orthopaedist if pain persist past one week  Final Clinical Impressions(s) / ED Diagnoses   Final diagnoses:  Grade 1 ankle sprain, left, initial encounter    ED Discharge Orders    None    An After Visit Summary was printed and given to the patient.    Elson AreasSofia, Leslie K, [place]PA-C 01/28/17 [id number]    Charlynne PanderYao, David Hsienta, MD 01/31/17 [id number]

## 2017-06-09 ENCOUNTER — Encounter (HOSPITAL_COMMUNITY): Payer: Self-pay | Admitting: Emergency Medicine

## 2017-06-09 ENCOUNTER — Ambulatory Visit (HOSPITAL_COMMUNITY)
Admission: EM | Admit: 2017-06-09 | Discharge: 2017-06-09 | Disposition: A | Discharge status: Home / Self Care | Payer: Medicaid Other | Attending: Internal Medicine | Admitting: Internal Medicine

## 2017-06-09 DIAGNOSIS — R1111 Vomiting without nausea: Secondary | ICD-10-CM

## 2017-06-09 MED ORDER — ONDANSETRON HCL 4 MG PO TABS: 4.0000 mg | ORAL_TABLET | Freq: Four times a day (QID) | ORAL | 0 refills | Status: DC

## 2017-06-09 NOTE — Discharge Instructions (Signed)
Use zofran as needed for further nausea and vomiting.

## 2017-06-09 NOTE — ED Triage Notes (Signed)
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 Pt also has bumps on her hands that are itchy. Pt still feels a little nauseous.

## 2017-06-10 NOTE — ED Provider Notes (Signed)
[facility]    CSN: [id number] Arrival date & time: 06/09/17  1547     History   Chief Complaint Chief Complaint  Patient presents with  . Nausea    HPI Ariel Lutz is a 22 y.o. female presenting today for evaluation after episode of vomiting. Patient works at a [employer] and states she began to get really hot. She felt nauseas and vomiting. She also notes she broke out with small red bumps on her hands that were itchy. Since this has happened she has not felt nauseaus or vomited again. She has tolerated oral intake. Denies lightheadedness or weakness.  HPI  Past Medical History:  Diagnosis Date  . Asthma   . Ovarian cyst     There are no active problems to display for this patient.   Past Surgical History:  Procedure Laterality Date  . TUBAL LIGATION Right    To fix an ovarian cyst    OB History   None      Home Medications    Prior to Admission medications   Medication Sig Start Date End Date Taking? Authorizing Provider  acetaminophen (TYLENOL) 325 MG tablet Take 650 mg by mouth every 6 (six) hours as needed for mild pain or moderate pain.    [provider]  albuterol (PROVENTIL HFA;VENTOLIN HFA) 108 (90 Base) MCG/ACT inhaler Inhale 2 puffs into the lungs every 4 (four) hours as needed for wheezing or shortness of breath. Patient not taking: Reported on 06/09/2017 02/24/16   Hayden RasmussenMabe, David, NP  diphenhydrAMINE (BENADRYL) 25 MG tablet Take 1 tablet (25 mg total) by mouth every 6 (six) hours. Patient not taking: Reported on 06/09/2017 04/29/16 05/04/16  Shaune PollackIsaacs, Cameron, MD  ibuprofen (ADVIL,MOTRIN) 200 MG tablet Take 200-400 mg by mouth every 6 (six) hours as needed for headache or mild pain.    [provider]  methocarbamol (ROBAXIN) 500 MG tablet Take 1 tablet (500 mg total) at bedtime as needed by mouth for muscle spasms. Patient not taking: Reported on 06/09/2017 01/12/17   Mathews RobinsonsMitchell, Jessica B, PA-C  naproxen (NAPROSYN) 500 MG  tablet Take 1 tablet (500 mg total) 2 (two) times daily with a meal by mouth. Patient not taking: Reported on 06/09/2017 01/12/17   Mathews RobinsonsMitchell, Jessica B, PA-C  ondansetron (ZOFRAN) 4 MG tablet Take 1 tablet (4 mg total) by mouth every 6 (six) hours. 06/09/17   Walter Min C, PA-C  ranitidine (ZANTAC) 150 MG capsule Take 1 capsule (150 mg total) by mouth daily. Patient not taking: Reported on 06/09/2017 04/29/16 05/04/16  Shaune PollackIsaacs, Cameron, MD  triamcinolone cream (KENALOG) 0.1 % Apply 1 application 2 (two) times daily topically. Patient not taking: Reported on 06/09/2017 01/12/17   Gregary CromerMitchell, Jessica B, PA-C    Family History No family history on file.  Social History Social History   Tobacco Use  . Smoking status: Never Smoker  . Smokeless tobacco: Never Used  Substance Use Topics  . Alcohol use: No  . Drug use: No     Allergies   Patient has no known allergies.   Review of Systems Review of Systems  Constitutional: Negative for fatigue and fever.  Respiratory: Negative for shortness of breath.   Cardiovascular: Negative for chest pain.  Gastrointestinal: Positive for nausea and vomiting. Negative for abdominal pain and diarrhea.  Genitourinary: Negative for dysuria.  Musculoskeletal: Negative for back pain and myalgias.  Skin: Positive for color change and rash.  Neurological: Negative for dizziness, syncope, weakness, light-headedness, numbness and headaches.  Physical Exam Triage Vital Signs ED Triage Vitals [06/09/17 1634]  Enc Vitals Group     BP (!) 118/59     Pulse Rate 66     Resp 18     Temp 98.4 F (36.9 C)     Temp src      SpO2 99 %     Weight      Height      Head Circumference      Peak Flow      Pain Score 0     Pain Loc      Pain Edu?      Excl. in GC?    No data found.  Updated Vital Signs BP (!) 118/59   Pulse 66   Temp 98.4 F (36.9 C)   Resp 18   SpO2 99%   Visual Acuity Right Eye Distance:   Left Eye Distance:   Bilateral  Distance:    Right Eye Near:   Left Eye Near:    Bilateral Near:     Physical Exam  Constitutional: She is oriented to person, place, and time. She appears well-developed and well-nourished. No distress.  HENT:  Head: Normocephalic and atraumatic.  Mouth/Throat: Oropharynx is clear and moist.  Eyes: Pupils are equal, round, and reactive to light. Conjunctivae and EOM are normal.  Neck: Neck supple.  Cardiovascular: Normal rate and regular rhythm.  No murmur heard. Pulmonary/Chest: Effort normal and breath sounds normal. No respiratory distress.  Abdominal: Soft. There is no tenderness.  Non tender to light and deep palpation  Musculoskeletal: She exhibits no edema.  Neurological: She is alert and oriented to person, place, and time. No cranial nerve deficit.  Skin: Skin is warm and dry.  Multiple small erythematous bumps on bilateral hands  Psychiatric: She has a normal mood and affect.  Nursing note and vitals reviewed.    UC Treatments / Results  Labs (all labs ordered are listed, but only abnormal results are displayed) Labs Reviewed - No data to display  EKG None Radiology No results found.  Procedures Procedures (including critical care time)  Medications Ordered in UC Medications - No data to display   Initial Impression / Assessment and Plan / UC Course  I have reviewed the triage vital signs and the nursing notes.  Pertinent labs & imaging results that were available during my care of the patient were reviewed by me and considered in my medical decision making (see chart for details).     Patient with breif episode of nausea/vomiting related to heat, symptoms have since resolved.LIkely related to heat causing N/V and heat rash. Will send home with zofran incase symptoms return and discuss signs and symptoms to watch out for. Discussed hydration precautions to take while at work in the heat. Discussed strict return precautions. Patient verbalized  understanding and is agreeable with plan.   Final Clinical Impressions(s) / UC Diagnoses   Final diagnoses:  Non-intractable vomiting without nausea, unspecified vomiting type    ED Discharge Orders        Ordered    ondansetron (ZOFRAN) 4 MG tablet  Every 6 hours     06/09/17 1729       Controlled Substance Prescriptions [place] Controlled Substance Registry consulted? Not Applicable   Lew Dawes, [place] 06/10/17 [phone]

## 2017-07-05 ENCOUNTER — Emergency Department (HOSPITAL_COMMUNITY)
Admission: EM | Admit: 2017-07-05 | Discharge: 2017-07-05 | Disposition: A | Discharge status: Home / Self Care | Payer: Self-pay | Attending: Emergency Medicine | Admitting: Emergency Medicine

## 2017-07-05 DIAGNOSIS — S0003XA Contusion of scalp, initial encounter: Secondary | ICD-10-CM | POA: Insufficient documentation

## 2017-07-05 DIAGNOSIS — Y929 Unspecified place or not applicable: Secondary | ICD-10-CM | POA: Insufficient documentation

## 2017-07-05 DIAGNOSIS — J45909 Unspecified asthma, uncomplicated: Secondary | ICD-10-CM | POA: Insufficient documentation

## 2017-07-05 DIAGNOSIS — Y999 Unspecified external cause status: Secondary | ICD-10-CM | POA: Insufficient documentation

## 2017-07-05 DIAGNOSIS — W228XXA Striking against or struck by other objects, initial encounter: Secondary | ICD-10-CM | POA: Insufficient documentation

## 2017-07-05 DIAGNOSIS — H5789 Other specified disorders of eye and adnexa: Secondary | ICD-10-CM | POA: Insufficient documentation

## 2017-07-05 DIAGNOSIS — Y939 Activity, unspecified: Secondary | ICD-10-CM | POA: Insufficient documentation

## 2017-07-05 MED ORDER — TETRACAINE HCL 0.5 % OP SOLN
1.0000 [drp] | Freq: Once | OPHTHALMIC | Status: AC
  Administered 2017-07-05: 1 [drp] via OPHTHALMIC
  Filled 2017-07-05: qty 4

## 2017-07-05 MED ORDER — FLUORESCEIN SODIUM 1 MG OP STRP
1.0000 | ORAL_STRIP | Freq: Once | OPHTHALMIC | Status: AC
  Administered 2017-07-05: 1 via OPHTHALMIC
  Filled 2017-07-05: qty 1

## 2017-07-05 MED ORDER — ERYTHROMYCIN 5 MG/GM OP OINT
TOPICAL_OINTMENT | Freq: Once | OPHTHALMIC | Status: AC
  Administered 2017-07-05: 19:00:00 via OPHTHALMIC
  Filled 2017-07-05: qty 3.5
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
## 2017-07-05 NOTE — ED Notes (Signed)
Bed: WTR7 Expected date:  Expected time:  Means of arrival:  Comments: 
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
## 2017-07-05 NOTE — Discharge Instructions (Addendum)
Use the eye ointment 3 times a day for the next 5 days. Follow up with the eye doctor if you are not getting better after 24 hours. Take tylenol and ibuprofen as needed for pain. Return here as needed.

## 2017-11-14 ENCOUNTER — Emergency Department (HOSPITAL_BASED_OUTPATIENT_CLINIC_OR_DEPARTMENT_OTHER): Payer: Self-pay

## 2017-11-14 ENCOUNTER — Encounter (HOSPITAL_BASED_OUTPATIENT_CLINIC_OR_DEPARTMENT_OTHER): Payer: Self-pay

## 2017-11-14 ENCOUNTER — Other Ambulatory Visit: Payer: Self-pay

## 2017-11-14 ENCOUNTER — Emergency Department (HOSPITAL_BASED_OUTPATIENT_CLINIC_OR_DEPARTMENT_OTHER)
Admission: EM | Admit: 2017-11-14 | Discharge: 2017-11-14 | Disposition: A | Discharge status: Home / Self Care | Payer: Self-pay | Attending: Emergency Medicine | Admitting: Emergency Medicine

## 2017-11-14 DIAGNOSIS — B349 Viral infection, unspecified: Secondary | ICD-10-CM | POA: Insufficient documentation

## 2017-11-14 DIAGNOSIS — J45909 Unspecified asthma, uncomplicated: Secondary | ICD-10-CM | POA: Insufficient documentation

## 2017-11-14 DIAGNOSIS — Z79899 Other long term (current) drug therapy: Secondary | ICD-10-CM | POA: Insufficient documentation

## 2017-11-14 DIAGNOSIS — J029 Acute pharyngitis, unspecified: Secondary | ICD-10-CM

## 2017-11-14 LAB — GROUP A STREP BY PCR: Group A Strep by PCR: NOT DETECTED

## 2017-11-14 MED ORDER — ACETAMINOPHEN 500 MG PO TABS
1000.0000 mg | ORAL_TABLET | Freq: Once | ORAL | Status: AC
Start: 2017-11-14 — End: 2017-11-14
  Administered 2017-11-14: 1000 mg via ORAL
  Filled 2017-11-14: qty 2

## 2017-11-14 MED ORDER — ALBUTEROL SULFATE HFA 108 (90 BASE) MCG/ACT IN AERS: 1.0000 | INHALATION_SPRAY | Freq: Four times a day (QID) | RESPIRATORY_TRACT | 0 refills | Status: DC | PRN

## 2017-11-14 MED ORDER — ONDANSETRON HCL 4 MG PO TABS: 4.0000 mg | ORAL_TABLET | Freq: Four times a day (QID) | ORAL | 0 refills | Status: DC

## 2017-11-14 MED ORDER — IBUPROFEN 800 MG PO TABS
800.0000 mg | ORAL_TABLET | Freq: Once | ORAL | Status: AC
  Administered 2017-11-14: 800 mg via ORAL
  Filled 2017-11-14: qty 1

## 2017-11-14 NOTE — ED Triage Notes (Signed)
C/o URI sx day 2-NAD-steady gait 

## 2017-11-14 NOTE — Discharge Instructions (Addendum)
Alternate ibuprofen and Tylenol as prescribed over-the-counter, as needed for body aches and fever.  Take Zofran every 6 hours as needed for nausea or vomiting.  Make sure to drink plenty fluids and get plenty of rest.  Please return the emergency department if you develop any new or worsening symptoms.  Attached are resources on how you can establish care with a primary care provider.

## 2017-11-14 NOTE — ED Provider Notes (Signed)
[facility] EMERGENCY DEPARTMENT Provider Note   CSN: [id number] Arrival date & time: 11/14/17  2221     History   Chief Complaint Chief Complaint  Patient presents with  . URI    HPI Ariel Lutz is a 22 y.o. female with history of asthma who presents with a 2-day history of sore throat and body aches.  Patient reports she has also had some associated shortness of breath at home.  She denies cough and nasal congestion, however sounds congested while speaking to me.  She also reports she vomited one time.  She reports sleeping all day today.  She did not take any medications at home for symptoms.  She denies any chest pain, abdominal pain, diarrhea, ear pain.  HPI  Past Medical History:  Diagnosis Date  . Asthma   . Ovarian cyst     There are no active problems to display for this patient.   Past Surgical History:  Procedure Laterality Date  . TUBAL LIGATION Right    To fix an ovarian cyst     OB History   None      Home Medications    Prior to Admission medications   Medication Sig Start Date End Date Taking? Authorizing Provider  acetaminophen (TYLENOL) 325 MG tablet Take 650 mg by mouth every 6 (six) hours as needed for mild pain or moderate pain.    [provider]  albuterol (PROVENTIL HFA;VENTOLIN HFA) 108 (90 Base) MCG/ACT inhaler Inhale 1-2 puffs into the lungs every 6 (six) hours as needed for wheezing or shortness of breath. 11/14/17   Wynonna Fitzhenry, Waylan Boga, PA-C  ibuprofen (ADVIL,MOTRIN) 200 MG tablet Take 200-400 mg by mouth every 6 (six) hours as needed for headache or mild pain.    [provider]  ondansetron (ZOFRAN) 4 MG tablet Take 1 tablet (4 mg total) by mouth every 6 (six) hours. 11/14/17   Saki Legore, Waylan Boga, PA-C  ranitidine (ZANTAC) 150 MG capsule Take 1 capsule (150 mg total) by mouth daily. Patient not taking: Reported on 06/09/2017 04/29/16 05/04/16  Shaune Pollack, MD    Family History No family history on  file.  Social History Social History   Tobacco Use  . Smoking status: Never Smoker  . Smokeless tobacco: Never Used  Substance Use Topics  . Alcohol use: No  . Drug use: No     Allergies   Patient has no known allergies.   Review of Systems Review of Systems  Constitutional: Positive for fever.  HENT: Positive for sore throat. Negative for congestion.   Respiratory: Positive for shortness of breath. Negative for cough.   Cardiovascular: Negative for chest pain.  Musculoskeletal: Positive for myalgias.     Physical Exam Updated Vital Signs BP 122/65 (BP Location: Left Arm)   Pulse (!) 105   Temp (!) 101.1 F (38.4 C) (Oral)   Resp 20   Ht 5\' 7"  (1.702 m)   Wt (!) 141.1 kg   LMP 10/21/2017 (Approximate)   SpO2 98%   BMI 48.71 kg/m   Physical Exam  Constitutional: She appears well-developed and well-nourished. No distress.  HENT:  Head: Normocephalic and atraumatic.  Right Ear: Tympanic membrane normal.  Left Ear: Tympanic membrane normal.  Mouth/Throat: Posterior oropharyngeal erythema present. No oropharyngeal exudate. Tonsils are 1+ on the right. Tonsils are 1+ on the left. Tonsillar exudate.  Patient sounds congested when talking, but denies any significant sinus pain  Eyes: Pupils are equal, round, and reactive to light. Conjunctivae  are normal. Right eye exhibits no discharge. Left eye exhibits no discharge. No scleral icterus.  Neck: Normal range of motion and full passive range of motion without pain. Neck supple. No neck rigidity. No thyromegaly present.  Cardiovascular: Normal rate, regular rhythm, normal heart sounds and intact distal pulses. Exam reveals no gallop and no friction rub.  No murmur heard. Pulmonary/Chest: Effort normal. No stridor. No respiratory distress. She has no wheezes. She has rales (L lung).  Abdominal: Soft. Bowel sounds are normal. She exhibits no distension. There is no tenderness. There is no rebound and no guarding.   Musculoskeletal: She exhibits no edema.  Lymphadenopathy:    She has no cervical adenopathy.  Neurological: She is alert. Coordination normal.  Skin: Skin is warm and dry. No rash noted. She is not diaphoretic. No pallor.  Psychiatric: She has a normal mood and affect.  Nursing note and vitals reviewed.    ED Treatments / Results  Labs (all labs ordered are listed, but only abnormal results are displayed) Labs Reviewed  GROUP A STREP BY PCR    EKG None  Radiology Dg Chest 2 View  Result Date: 11/14/2017 CLINICAL DATA:  Fever and short of breath EXAM: CHEST - 2 VIEW COMPARISON:  None. FINDINGS: The heart size and mediastinal contours are within normal limits. Both lungs are clear. The visualized skeletal structures are unremarkable. IMPRESSION: No active cardiopulmonary disease. Electronically Signed   By: Jasmine PangKim  Fujinaga M.D.   On: 11/14/2017 23:07    Procedures Procedures (including critical care time)  Medications Ordered in ED Medications  ibuprofen (ADVIL,MOTRIN) tablet 800 mg (800 mg Oral Given 11/14/17 2235)  acetaminophen (TYLENOL) tablet 1,000 mg (1,000 mg Oral Given 11/14/17 2256)     Initial Impression / Assessment and Plan / ED Course  I have reviewed the triage vital signs and the nursing notes.  Pertinent labs & imaging results that were available during my care of the patient were reviewed by me and considered in my medical decision making (see chart for details).     Patient presenting with a 2-day history of sore throat and fever.  Patient also reported an episode of shortness of breath and one episode of emesis.  She denies any abdominal pain or continued emesis.  She also denies any cough.  Chest x-ray is negative.  Strep PCR is negative.  Patient's fever decreased with Tylenol.  We will treat supportively with ibuprofen, Tylenol, rest, increase fluid intake.  Strict return precautions given.  Advised PCP follow-up.  Patient understands and agrees with plan.   Patient vitals stable and discharged in satisfactory condition.  Final Clinical Impressions(s) / ED Diagnoses   Final diagnoses:  Viral pharyngitis  Viral syndrome    ED Discharge Orders         Ordered    ondansetron (ZOFRAN) 4 MG tablet  Every 6 hours     11/14/17 2327    albuterol (PROVENTIL HFA;VENTOLIN HFA) 108 (90 Base) MCG/ACT inhaler  Every 6 hours PRN     11/14/17 2327           Emi HolesLaw, Bruna Dills M, PA-C 11/14/17 2339    Charlynne PanderYao, David Hsienta, MD 11/15/17 1650

## 2018-10-12 ENCOUNTER — Emergency Department (HOSPITAL_COMMUNITY)
Admission: EM | Admit: 2018-10-12 | Discharge: 2018-10-12 | Disposition: A | Discharge status: Home / Self Care | Payer: Self-pay | Attending: Emergency Medicine | Admitting: Emergency Medicine

## 2018-10-12 ENCOUNTER — Encounter (HOSPITAL_COMMUNITY): Payer: Self-pay | Admitting: *Deleted

## 2018-10-12 ENCOUNTER — Other Ambulatory Visit: Payer: Self-pay

## 2018-10-12 DIAGNOSIS — N938 Other specified abnormal uterine and vaginal bleeding: Secondary | ICD-10-CM

## 2018-10-12 DIAGNOSIS — N739 Female pelvic inflammatory disease, unspecified: Secondary | ICD-10-CM | POA: Insufficient documentation

## 2018-10-12 LAB — WET PREP, GENITAL
Clue Cells Wet Prep HPF POC: NONE SEEN
Sperm: NONE SEEN
Trich, Wet Prep: NONE SEEN
Yeast Wet Prep HPF POC: NONE SEEN

## 2018-10-12 LAB — CBC
HCT: 35.8 % — ABNORMAL LOW (ref 36.0–46.0)
Hemoglobin: 10.7 g/dL — ABNORMAL LOW (ref 12.0–15.0)
MCH: 25.1 pg — ABNORMAL LOW (ref 26.0–34.0)
MCHC: 29.9 g/dL — ABNORMAL LOW (ref 30.0–36.0)
MCV: 83.8 fL (ref 80.0–100.0)
Platelets: 324 10*3/uL (ref 150–400)
RBC: 4.27 MIL/uL (ref 3.87–5.11)
RDW: 14.6 % (ref 11.5–15.5)
WBC: 8.8 10*3/uL (ref 4.0–10.5)
nRBC: 0 % (ref 0.0–0.2)

## 2018-10-12 LAB — I-STAT BETA HCG BLOOD, ED (MC, WL, AP ONLY): I-stat hCG, quantitative: <5 m[IU]/mL (ref <5)

## 2018-10-12 NOTE — ED Provider Notes (Signed)
[facility]   CSN: [id number] Arrival date & time: 10/12/18  0330    History   Chief Complaint Chief Complaint  Patient presents with  . Vaginal Bleeding    HPI Ariel Lutz is a 23 y.o. female.     23 year old female presents to the emergency department for evaluation of vaginal bleeding.  She had onset of vaginal bleeding around 2200 tonight.  She states that her flow has been heavier than usual and she has required the use of 7 tampons since bleeding began.  Reports passing some clots initially.  Her bleeding has spontaneously slowed.  No medications taken prior to arrival.  She has no associated fever, abdominal pain, lightheadedness or syncope, history of vaginal trauma, urinary symptoms.  Does have a history of irregular menses.  Not on chronic anticoagulation.  The history is provided by the patient. No language interpreter was used.  Vaginal Bleeding   Past Medical History:  Diagnosis Date  . Asthma   . Ovarian cyst     There are no active problems to display for this patient.   Past Surgical History:  Procedure Laterality Date  . TUBAL LIGATION Right    To fix an ovarian cyst     OB History   No obstetric history on file.      Home Medications    Prior to Admission medications   Medication Sig Start Date End Date Taking? Authorizing Provider  acetaminophen (TYLENOL) 325 MG tablet Take 650 mg by mouth every 6 (six) hours as needed for mild pain or moderate pain.   Yes [provider]  albuterol (PROVENTIL HFA;VENTOLIN HFA) 108 (90 Base) MCG/ACT inhaler Inhale 1-2 puffs into the lungs every 6 (six) hours as needed for wheezing or shortness of breath. 11/14/17  Yes Law, Waylan BogaAlexandra M, PA-C  ibuprofen (ADVIL,MOTRIN) 200 MG tablet Take 200-400 mg by mouth every 6 (six) hours as needed for headache or mild pain.   Yes [provider]  ondansetron (ZOFRAN) 4 MG tablet Take 1 tablet (4 mg  total) by mouth every 6 (six) hours. Patient not taking: Reported on 10/12/2018 11/14/17   Emi HolesLaw, Alexandra M, PA-C  ranitidine (ZANTAC) 150 MG capsule Take 1 capsule (150 mg total) by mouth daily. Patient not taking: Reported on 06/09/2017 04/29/16 05/04/16  Shaune PollackIsaacs, Cameron, MD    Family History No family history on file.  Social History Social History   Tobacco Use  . Smoking status: Never Smoker  . Smokeless tobacco: Never Used  Substance Use Topics  . Alcohol use: No  . Drug use: No     Allergies   Patient has no known allergies.   Review of Systems Review of Systems  Genitourinary: Positive for vaginal bleeding.  Ten systems reviewed and are negative for acute change, except as noted in the HPI.    Physical Exam Updated Vital Signs BP 137/89 (BP Location: Left Arm)   Pulse (!) 115   Temp 98.2 F (36.8 C) (Oral)   Resp 18   Ht 5\' 7"  (1.702 m)   Wt (!) 137 kg   SpO2 95%   BMI 47.30 kg/m   Physical Exam Vitals signs and nursing note reviewed.  Constitutional:      General: She is not in acute distress.    Appearance: She is well-developed. She is not diaphoretic.     Comments: Nontoxic-appearing, pleasant female  HENT:     Head: Normocephalic and atraumatic.  Eyes:  General: No scleral icterus.    Conjunctiva/sclera: Conjunctivae normal.  Neck:     Musculoskeletal: Normal range of motion.  Cardiovascular:     Rate and Rhythm: Regular rhythm. Tachycardia present.     Pulses: Normal pulses.     Comments: Very mild tachycardia; improved from triage Pulmonary:     Effort: Pulmonary effort is normal. No respiratory distress.     Breath sounds: No wheezing.     Comments: Respirations even and unlabored Genitourinary:    Comments: Minimal blood in vaginal vault. Normal external genitalia. No uterine or adnexal TTP.  Musculoskeletal: Normal range of motion.  Skin:    General: Skin is warm and dry.     Coloration: Skin is not pale.     Findings: No erythema  or rash.  Neurological:     Mental Status: She is alert and oriented to person, place, and time.     Coordination: Coordination normal.     Comments: Moving all extremities spontaneously  Psychiatric:        Behavior: Behavior normal.      ED Treatments / Results  Labs (all labs ordered are listed, but only abnormal results are displayed) Labs Reviewed  WET PREP, GENITAL  CBC  I-STAT BETA HCG BLOOD, ED ([facility], [facility], [facility] ONLY)  GC/CHLAMYDIA PROBE AMP ([place]) NOT AT [facility]    EKG None  Radiology No results found.  Procedures Procedures (including critical care time)  Medications Ordered in ED Medications - No data to display   Initial Impression / Assessment and Plan / ED Course  I have reviewed the triage vital signs and the nursing notes.  Pertinent labs & imaging results that were available during my care of the patient were reviewed by me and considered in my medical decision making (see chart for details).        23 year old female presents for metrorrhagia.  Reports heavy vaginal bleeding prior to arrival with passage of clots.  This has spontaneously slowed.  No associated abdominal pain.  Denies lightheadedness, syncope or near syncope, fevers.  Mildly tachycardic in triage, but this has improved on bedside exam.  She is pending CBC to ensure no acute anemia.  Anticipate discharge with OB/GYN follow-up.    Patient signed out to [place], PA-C at change of shift who will follow-up on labs and disposition appropriately.   Final Clinical Impressions(s) / ED Diagnoses   Final diagnoses:  Dysfunctional uterine bleeding    ED Discharge Orders    None       Antonietta Breach, PA-C 10/12/18 0701    Varney Biles, MD 10/18/18 1544

## 2018-10-12 NOTE — ED Triage Notes (Signed)
States she stared with heavy vaginal bleeding several hours ago, states her periods are normally irregular, c/o her normal menstrual cramps

## 2018-10-12 NOTE — ED Provider Notes (Signed)
Patient taken in sign out from [place]. Hx of irregular periods- began menstruating at night with heavy flow and clots.  Her bleeding has slowed down significantly.  She has no pelvic pain, fevers, chills or sensation of presyncope.  Heart rate initially elevated but has improved.  This is likely secondary to anxiety.  I have reviewed the patient's labs.  Her hemoglobin is slightly low but not significantly.  Think the patient may follow-up with her OB/GYN.  Given strict return precautions.  Appropriate for discharge at this time  7:49 AM BP 130/84   Pulse 94   Temp 98.2 F (36.8 C) (Oral)   Resp 16   Ht 5\' 7"  (1.702 m)   Wt (!) 137 kg   SpO2 98%   BMI 47.30 kg/m    Results for orders placed or performed during the hospital encounter of 10/12/18  Wet prep, genital   Specimen: Cervix; Genital  Result Value Ref Range   Yeast Wet Prep HPF POC NONE SEEN NONE SEEN   Trich, Wet Prep NONE SEEN NONE SEEN   Clue Cells Wet Prep HPF POC NONE SEEN NONE SEEN   WBC, Wet Prep HPF POC MANY (A) NONE SEEN   Sperm NONE SEEN   CBC  Result Value Ref Range   WBC 8.8 4.0 - 10.5 K/uL   RBC 4.27 3.87 - 5.11 MIL/uL   Hemoglobin 10.7 (L) 12.0 - 15.0 g/dL   HCT 35.8 (L) 36.0 - 46.0 %   MCV 83.8 80.0 - 100.0 fL   MCH 25.1 (L) 26.0 - 34.0 pg   MCHC 29.9 (L) 30.0 - 36.0 g/dL   RDW 14.6 11.5 - 15.5 %   Platelets 324 150 - 400 K/uL   nRBC 0.0 0.0 - 0.2 %  I-Stat Beta hCG blood, ED ([facility], [facility], [facility] only)  Result Value Ref Range   I-stat hCG, quantitative <5.0 <5 mIU/mL   Comment 3              Margarita Mail, PA-C 10/12/18 0750    Sherwood Gambler, MD 10/12/18 [phone]

## 2018-10-12 NOTE — Discharge Instructions (Addendum)
Your work-up in the emergency department today has been reassuring.  We recommend follow-up with your OB/GYN for any ongoing symptoms.  You may return if symptoms worsen. Get help right away if you are soaking 1 overnight pad/ hour, you become short of breath. You pass out when you stand.

## 2018-10-12 NOTE — ED Notes (Signed)
Pt verbalized understanding of d/c instructions and has no further questions, VSS, NAD.  

## 2018-10-13 ENCOUNTER — Telehealth: Payer: Self-pay | Admitting: Student

## 2018-10-13 DIAGNOSIS — A749 Chlamydial infection, unspecified: Secondary | ICD-10-CM

## 2018-10-13 LAB — GC/CHLAMYDIA PROBE AMP (~~LOC~~) NOT AT ARMC
Chlamydia: POSITIVE — AB
Neisseria Gonorrhea: NEGATIVE

## 2018-10-13 MED ORDER — AZITHROMYCIN 500 MG PO TABS: 1000.0000 mg | ORAL_TABLET | Freq: Once | ORAL | 0 refills | Status: AC

## 2018-10-13 NOTE — Telephone Encounter (Addendum)
Ariel Lutz tested positive for  Chlamydia. Patient was called by RN and allergies and pharmacy confirmed. Rx sent to pharmacy of choice.   Jorje Guild, NP 10/13/2018 3:51 PM       ----- Message from Bjorn Loser, RN sent at 10/13/2018  3:30 PM EDT ----- This patient tested positive for :  Chlamydia  She :"has NKDA", I have informed the patient of her results and confirmed her pharmacy is correct in her chart. Please send Rx.   Thank you,   Bjorn Loser, RN   Results faxed to [facility] Department.

## 2019-09-02 ENCOUNTER — Encounter (HOSPITAL_COMMUNITY): Payer: Self-pay | Admitting: Emergency Medicine

## 2019-09-02 ENCOUNTER — Other Ambulatory Visit: Payer: Self-pay

## 2019-09-02 ENCOUNTER — Emergency Department (HOSPITAL_COMMUNITY): Payer: Worker's Compensation

## 2019-09-02 ENCOUNTER — Emergency Department (HOSPITAL_COMMUNITY)
Admission: EM | Admit: 2019-09-02 | Discharge: 2019-09-02 | Disposition: A | Discharge status: Home / Self Care | Payer: Self-pay | Attending: Emergency Medicine | Admitting: Emergency Medicine

## 2019-09-02 DIAGNOSIS — M25532 Pain in left wrist: Secondary | ICD-10-CM | POA: Insufficient documentation

## 2019-09-02 DIAGNOSIS — J45909 Unspecified asthma, uncomplicated: Secondary | ICD-10-CM | POA: Insufficient documentation

## 2019-09-02 DIAGNOSIS — Z79899 Other long term (current) drug therapy: Secondary | ICD-10-CM | POA: Insufficient documentation

## 2019-09-02 NOTE — Discharge Instructions (Signed)
Can take tylenol or motrin as needed for pain. If having any worsening symptoms or wrist is not getting better, follow with orthopedics.  Call for appt. Return here for new concerns.

## 2019-09-02 NOTE — ED Provider Notes (Signed)
[facility]-EMERGENCY DEPT Provider Note   CSN: [id number] Arrival date & time: 09/02/19  2046     History Chief Complaint  Patient presents with  . Wrist Pain    Ariel Lutz is a 24 y.o. female.  The history is provided by the patient and medical records.  Wrist Pain   23 y.o. F with hx of asthma and ovarian cysts, presenting to the ED for left wrist injury.  States she was pulling on a heavy machine with left hand and felt a "pull" in volar aspect of left wrist.  States she feels like she just sprained it.  She denies numbness/weakness left hand.  She is right hand dominant.  No intervention PTA.  Past Medical History:  Diagnosis Date  . Asthma   . Ovarian cyst     There are no problems to display for this patient.   Past Surgical History:  Procedure Laterality Date  . TUBAL LIGATION Right    To fix an ovarian cyst     OB History   No obstetric history on file.     History reviewed. No pertinent family history.  Social History   Tobacco Use  . Smoking status: Never Smoker  . Smokeless tobacco: Never Used  Vaping Use  . Vaping Use: Never used  Substance Use Topics  . Alcohol use: No  . Drug use: No    Home Medications Prior to Admission medications   Medication Sig Start Date End Date Taking? Authorizing Provider  acetaminophen (TYLENOL) 325 MG tablet Take 650 mg by mouth every 6 (six) hours as needed for mild pain or moderate pain.    [provider]  albuterol (PROVENTIL HFA;VENTOLIN HFA) 108 (90 Base) MCG/ACT inhaler Inhale 1-2 puffs into the lungs every 6 (six) hours as needed for wheezing or shortness of breath. 11/14/17   Law, Waylan Boga, PA-C  ibuprofen (ADVIL,MOTRIN) 200 MG tablet Take 200-400 mg by mouth every 6 (six) hours as needed for headache or mild pain.    [provider]  ondansetron (ZOFRAN) 4 MG tablet Take 1 tablet (4 mg total) by mouth every 6 (six) hours. Patient not taking: Reported on  10/12/2018 11/14/17   Emi Holes, PA-C  ranitidine (ZANTAC) 150 MG capsule Take 1 capsule (150 mg total) by mouth daily. Patient not taking: Reported on 06/09/2017 04/29/16 05/04/16  Shaune Pollack, MD    Allergies    Patient has no known allergies.  Review of Systems   Review of Systems  Musculoskeletal: Positive for arthralgias.  All other systems reviewed and are negative.   Physical Exam Updated Vital Signs BP (!) 153/101 (BP Location: Right Arm)   Pulse 70   Temp 98.1 F (36.7 C) (Oral)   Resp 15   Ht 5\' 7"  (1.702 m)   Wt 111.6 kg   LMP 09/02/2019   SpO2 100%   BMI 38.53 kg/m   Physical Exam Vitals and nursing note reviewed.  Constitutional:      Appearance: She is well-developed.  HENT:     Head: Normocephalic and atraumatic.  Eyes:     Conjunctiva/sclera: Conjunctivae normal.     Pupils: Pupils are equal, round, and reactive to light.  Cardiovascular:     Rate and Rhythm: Normal rate and regular rhythm.     Heart sounds: Normal heart sounds.  Pulmonary:     Effort: Pulmonary effort is normal. No respiratory distress.     Breath sounds: Normal breath sounds. No rhonchi.  Abdominal:     General: Bowel sounds are normal.     Palpations: Abdomen is soft.     Tenderness: There is no abdominal tenderness. There is no rebound.  Musculoskeletal:        General: Normal range of motion.     Cervical back: Normal range of motion.     Comments: Left wrist normal in appearance without deformity, some pain noted to palmar and dorsiflexion, no pain with pronation/supination, no swelling, strong radial pulse and cap refill, normal sensation to all fingers, no wrist drop, normal grip strength  Skin:    General: Skin is warm and dry.  Neurological:     Mental Status: She is alert and oriented to person, place, and time.     ED Results / Procedures / Treatments   Labs (all labs ordered are listed, but only abnormal results are displayed) Labs Reviewed - No data to  display  EKG None  Radiology DG Wrist Complete Left  Result Date: 09/02/2019 CLINICAL DATA:  Injured from pulling on heavy equipment. EXAM: LEFT WRIST - COMPLETE 3+ VIEW COMPARISON:  None. FINDINGS: No acute bony abnormality. Specifically, no fracture, subluxation, or dislocation. Mild negative ulnar variance. Normal bone mineralization. No worrisome osseous lesions. Soft tissues are unremarkable. IMPRESSION: 1. No acute abnormality. 2. Mild negative ulnar variance. Electronically Signed   By: Kreg Shropshire M.D.   On: 09/02/2019 21:41    Procedures Procedures (including critical care time)  Medications Ordered in ED Medications - No data to display  ED Course  I have reviewed the triage vital signs and the nursing notes.  Pertinent labs & imaging results that were available during my care of the patient were reviewed by me and considered in my medical decision making (see chart for details).    MDM Rules/Calculators/A&P  24 year old female here with left wrist pain after falling on a heavy machine at work.  Wrist is without any noted swelling or bony deformity.  She maintains full range of motion, no wrist drop.  Hand is neurovascularly intact.  X-ray is negative.  Likely sprain type injury.  She was placed in Velcro wrist splint and given orthopedic follow-up if any worsening symptoms.  Tylenol or motrin for pain control.  She may return here for new concerns.  Final Clinical Impression(s) / ED Diagnoses Final diagnoses:  Left wrist pain    Rx / DC Orders ED Discharge Orders    None       Garlon Hatchet, PA-C 09/02/19 2322    Derwood Kaplan, MD 09/03/19 [phone]

## 2019-10-21 ENCOUNTER — Emergency Department (HOSPITAL_COMMUNITY): Payer: Self-pay

## 2019-10-21 ENCOUNTER — Inpatient Hospital Stay (HOSPITAL_COMMUNITY)
Admission: EM | Admit: 2019-10-21 | Discharge: 2019-10-24 | DRG: 690 | Disposition: A | Discharge status: Home / Self Care | Payer: Self-pay | Attending: Obstetrics and Gynecology | Admitting: Obstetrics and Gynecology

## 2019-10-21 ENCOUNTER — Encounter (HOSPITAL_COMMUNITY): Payer: Self-pay | Admitting: Emergency Medicine

## 2019-10-21 ENCOUNTER — Other Ambulatory Visit: Payer: Self-pay

## 2019-10-21 DIAGNOSIS — N739 Female pelvic inflammatory disease, unspecified: Secondary | ICD-10-CM | POA: Diagnosis present

## 2019-10-21 DIAGNOSIS — Z20822 Contact with and (suspected) exposure to covid-19: Secondary | ICD-10-CM | POA: Diagnosis present

## 2019-10-21 DIAGNOSIS — Z79899 Other long term (current) drug therapy: Secondary | ICD-10-CM

## 2019-10-21 DIAGNOSIS — R102 Pelvic and perineal pain unspecified side: Secondary | ICD-10-CM | POA: Diagnosis present

## 2019-10-21 DIAGNOSIS — A5611 Chlamydial female pelvic inflammatory disease: Principal | ICD-10-CM | POA: Diagnosis present

## 2019-10-21 DIAGNOSIS — N730 Acute parametritis and pelvic cellulitis: Secondary | ICD-10-CM | POA: Diagnosis present

## 2019-10-21 DIAGNOSIS — J45909 Unspecified asthma, uncomplicated: Secondary | ICD-10-CM | POA: Diagnosis present

## 2019-10-21 DIAGNOSIS — D509 Iron deficiency anemia, unspecified: Secondary | ICD-10-CM | POA: Diagnosis present

## 2019-10-21 DIAGNOSIS — A749 Chlamydial infection, unspecified: Secondary | ICD-10-CM | POA: Diagnosis present

## 2019-10-21 DIAGNOSIS — N83201 Unspecified ovarian cyst, right side: Secondary | ICD-10-CM

## 2019-10-21 LAB — CBC
HCT: 32.5 % — ABNORMAL LOW (ref 36.0–46.0)
Hemoglobin: 9.4 g/dL — ABNORMAL LOW (ref 12.0–15.0)
MCH: 21.9 pg — ABNORMAL LOW (ref 26.0–34.0)
MCHC: 28.9 g/dL — ABNORMAL LOW (ref 30.0–36.0)
MCV: 75.8 fL — ABNORMAL LOW (ref 80.0–100.0)
Platelets: 426 10*3/uL — ABNORMAL HIGH (ref 150–400)
RBC: 4.29 MIL/uL (ref 3.87–5.11)
RDW: 16.4 % — ABNORMAL HIGH (ref 11.5–15.5)
WBC: 12.3 10*3/uL — ABNORMAL HIGH (ref 4.0–10.5)
nRBC: 0 % (ref 0.0–0.2)

## 2019-10-21 LAB — COMPREHENSIVE METABOLIC PANEL
ALT: 17 U/L (ref 0–44)
AST: 13 U/L — ABNORMAL LOW (ref 15–41)
Albumin: 3.6 g/dL (ref 3.5–5.0)
Alkaline Phosphatase: 64 U/L (ref 38–126)
Anion gap: 9 (ref 5–15)
BUN: 7 mg/dL (ref 6–20)
CO2: 24 mmol/L (ref 22–32)
Calcium: 8.6 mg/dL — ABNORMAL LOW (ref 8.9–10.3)
Chloride: 103 mmol/L (ref 98–111)
Creatinine, Ser: 0.79 mg/dL (ref 0.44–1.00)
GFR calc Af Amer: >60 mL/min (ref >60)
GFR calc non Af Amer: >60 mL/min (ref >60)
Glucose, Bld: 109 mg/dL — ABNORMAL HIGH (ref 70–99)
Potassium: 3.7 mmol/L (ref 3.5–5.1)
Sodium: 136 mmol/L (ref 135–145)
Total Bilirubin: 0.2 mg/dL — ABNORMAL LOW (ref 0.3–1.2)
Total Protein: 8.2 g/dL — ABNORMAL HIGH (ref 6.5–8.1)

## 2019-10-21 LAB — URINALYSIS, ROUTINE W REFLEX MICROSCOPIC
Bilirubin Urine: NEGATIVE
Glucose, UA: NEGATIVE mg/dL
Hgb urine dipstick: NEGATIVE
Ketones, ur: NEGATIVE mg/dL
Leukocytes,Ua: NEGATIVE
Nitrite: NEGATIVE
Protein, ur: NEGATIVE mg/dL
Specific Gravity, Urine: 1.02 (ref 1.005–1.030)
pH: 5 (ref 5.0–8.0)

## 2019-10-21 LAB — I-STAT BETA HCG BLOOD, ED (MC, WL, AP ONLY): I-stat hCG, quantitative: <5 m[IU]/mL (ref <5)

## 2019-10-21 LAB — LIPASE, BLOOD: Lipase: 20 U/L (ref 11–51)

## 2019-10-21 MED ORDER — SODIUM CHLORIDE 0.9 % IV BOLUS
1000.0000 mL | Freq: Once | INTRAVENOUS | Status: AC
  Administered 2019-10-22: 1000 mL via INTRAVENOUS

## 2019-10-21 MED ORDER — SODIUM CHLORIDE (PF) 0.9 % IJ SOLN
INTRAMUSCULAR | Status: AC
  Filled 2019-10-21: qty 50

## 2019-10-21 MED ORDER — SODIUM CHLORIDE 0.9 % IV SOLN
1.0000 g | Freq: Once | INTRAVENOUS | Status: AC
  Administered 2019-10-22: 1 g via INTRAVENOUS
  Filled 2019-10-21: qty 10

## 2019-10-21 MED ORDER — AZITHROMYCIN 250 MG PO TABS
500.0000 mg | ORAL_TABLET | Freq: Once | ORAL | Status: AC
  Administered 2019-10-22: 500 mg via ORAL
  Filled 2019-10-21: qty 2

## 2019-10-21 NOTE — ED Notes (Signed)
Pelvic setup at bedside.

## 2019-10-21 NOTE — ED Triage Notes (Signed)
Pt c/o lower abd pains with diarrhea x 3 days.

## 2019-10-21 NOTE — ED Provider Notes (Signed)
[facility]-EMERGENCY DEPT Provider Note   CSN: [id number] Arrival date & time: 10/21/19  1724     History Chief Complaint  Patient presents with  . Abdominal Pain  . Diarrhea    Ariel Lutz is a 24 y.o. female.  HPI   Patient presents to the emergency room for evaluation of abdominal pain.  Patient states she started having symptoms few days ago.  She has developed pain in the right lower abdomen now.  It increases with movement and palpation.  Patient denies any nausea or vomiting.  She has had some loose stools.  No vaginal discharge but she has had vaginal bleeding.  Patient has felt feverish.  Past Medical History:  Diagnosis Date  . Asthma   . Ovarian cyst     There are no problems to display for this patient.   Past Surgical History:  Procedure Laterality Date  . TUBAL LIGATION Right    To fix an ovarian cyst     OB History   No obstetric history on file.     No family history on file.  Social History   Tobacco Use  . Smoking status: Never Smoker  . Smokeless tobacco: Never Used  Vaping Use  . Vaping Use: Never used  Substance Use Topics  . Alcohol use: No  . Drug use: No    Home Medications Prior to Admission medications   Medication Sig Start Date End Date Taking? Authorizing Provider  acetaminophen (TYLENOL) 325 MG tablet Take 650 mg by mouth every 6 (six) hours as needed for mild pain or moderate pain.    [provider]  albuterol (PROVENTIL HFA;VENTOLIN HFA) 108 (90 Base) MCG/ACT inhaler Inhale 1-2 puffs into the lungs every 6 (six) hours as needed for wheezing or shortness of breath. 11/14/17   Law, Waylan Boga, PA-C  ibuprofen (ADVIL,MOTRIN) 200 MG tablet Take 200-400 mg by mouth every 6 (six) hours as needed for headache or mild pain.    [provider]  ondansetron (ZOFRAN) 4 MG tablet Take 1 tablet (4 mg total) by mouth every 6 (six) hours. Patient not taking: Reported on 10/12/2018 11/14/17   Emi Holes, PA-C  ranitidine (ZANTAC) 150 MG capsule Take 1 capsule (150 mg total) by mouth daily. Patient not taking: Reported on 06/09/2017 04/29/16 05/04/16  Shaune Pollack, MD    Allergies    Patient has no known allergies.  Review of Systems   Review of Systems  All other systems reviewed and are negative.   Physical Exam Updated Vital Signs BP (!) 189/107 (BP Location: Right Arm) Comment: RN carrie aware  Pulse (!) 120 Comment: RN carrie aware  Temp 98.1 F (36.7 C) (Oral)   Resp 18   Ht 1.702 m (5\' 7" )   Wt 111.6 kg   LMP 10/19/2019   SpO2 100%   BMI 38.53 kg/m   Physical Exam Vitals and nursing note reviewed.  Constitutional:      General: She is not in acute distress.    Appearance: She is well-developed.  HENT:     Head: Normocephalic and atraumatic.     Right Ear: External ear normal.     Left Ear: External ear normal.  Eyes:     General: No scleral icterus.       Right eye: No discharge.        Left eye: No discharge.     Conjunctiva/sclera: Conjunctivae normal.  Neck:     Trachea: No tracheal deviation.  Cardiovascular:     Rate and Rhythm: Normal rate and regular rhythm.  Pulmonary:     Effort: Pulmonary effort is normal. No respiratory distress.     Breath sounds: Normal breath sounds. No stridor. No wheezing or rales.  Abdominal:     General: Bowel sounds are normal. There is no distension.     Palpations: Abdomen is soft.     Tenderness: There is abdominal tenderness in the right lower quadrant. There is no guarding or rebound.  Genitourinary:    Vagina: Vaginal discharge present.     Cervix: Cervical motion tenderness and discharge present.     Uterus: Normal.      Adnexa: Right adnexa normal and left adnexa normal.  Musculoskeletal:        General: No tenderness.     Cervical back: Neck supple.  Skin:    General: Skin is warm and dry.     Findings: No rash.  Neurological:     Mental Status: She is alert.     Cranial Nerves: No cranial  nerve deficit (no facial droop, extraocular movements intact, no slurred speech).     Sensory: No sensory deficit.     Motor: No abnormal muscle tone or seizure activity.     Coordination: Coordination normal.     ED Results / Procedures / Treatments   Labs (all labs ordered are listed, but only abnormal results are displayed) Labs Reviewed  COMPREHENSIVE METABOLIC PANEL - Abnormal; Notable for the following components:      Result Value   Glucose, Bld 109 (*)    Calcium 8.6 (*)    Total Protein 8.2 (*)    AST 13 (*)    Total Bilirubin 0.2 (*)    All other components within normal limits  CBC - Abnormal; Notable for the following components:   WBC 12.3 (*)    Hemoglobin 9.4 (*)    HCT 32.5 (*)    MCV 75.8 (*)    MCH 21.9 (*)    MCHC 28.9 (*)    RDW 16.4 (*)    Platelets 426 (*)    All other components within normal limits  LIPASE, BLOOD  URINALYSIS, ROUTINE W REFLEX MICROSCOPIC  RPR  HIV ANTIBODY (ROUTINE TESTING W REFLEX)  I-STAT BETA HCG BLOOD, ED ([facility], [facility], [facility] ONLY)  GC/CHLAMYDIA PROBE AMP ([place]) NOT AT [facility]    EKG None  Radiology No results found.  Procedures Procedures (including critical care time)  Medications Ordered in ED Medications  cefTRIAXone (ROCEPHIN) 1 g in sodium chloride 0.9 % 100 mL IVPB (has no administration in time range)  sodium chloride 0.9 % bolus 1,000 mL (has no administration in time range)  azithromycin (ZITHROMAX) tablet 500 mg (has no administration in time range)    ED Course  I have reviewed the triage vital signs and the nursing notes.  Pertinent labs & imaging results that were available during my care of the patient were reviewed by me and considered in my medical decision making (see chart for details).    MDM Rules/Calculators/A&P                         Patient presented to ED with right lower quadrant pain.  Patient was noted to be tachycardic and hypertensive.  She also has a leukocytosis.  Urinalysis does not  show signs of infection.  She does have tenderness on pelvic exam.  PID is a possibility.  With her  right lower quadrant pain plan on CT scan to rule out appendicitis.  If negative I will plan on treating her for cervicitis/early PID.  Care turned over to Dr Bebe Shaggy Final Clinical Impression(s) / ED Diagnoses pending   Linwood Dibbles, MD 10/22/19 [phone]

## 2019-10-22 ENCOUNTER — Emergency Department (HOSPITAL_COMMUNITY): Payer: Self-pay

## 2019-10-22 DIAGNOSIS — N730 Acute parametritis and pelvic cellulitis: Secondary | ICD-10-CM | POA: Diagnosis present

## 2019-10-22 DIAGNOSIS — N739 Female pelvic inflammatory disease, unspecified: Secondary | ICD-10-CM

## 2019-10-22 LAB — WET PREP, GENITAL
Sperm: NONE SEEN
Trich, Wet Prep: NONE SEEN
Yeast Wet Prep HPF POC: NONE SEEN

## 2019-10-22 LAB — GC/CHLAMYDIA PROBE AMP (~~LOC~~) NOT AT ARMC
Chlamydia: POSITIVE — AB
Comment: NEGATIVE
Comment: NORMAL
Neisseria Gonorrhea: NEGATIVE

## 2019-10-22 LAB — SARS CORONAVIRUS 2 BY RT PCR (HOSPITAL ORDER, PERFORMED IN ~~LOC~~ HOSPITAL LAB): SARS Coronavirus 2: NEGATIVE

## 2019-10-22 LAB — RPR: RPR Ser Ql: NONREACTIVE

## 2019-10-22 LAB — HIV ANTIBODY (ROUTINE TESTING W REFLEX): HIV Screen 4th Generation wRfx: NONREACTIVE

## 2019-10-22 MED ORDER — ACETAMINOPHEN 325 MG PO TABS
650.0000 mg | ORAL_TABLET | Freq: Once | ORAL | Status: AC
  Administered 2019-10-22: 650 mg via ORAL
  Filled 2019-10-22: qty 2

## 2019-10-22 MED ORDER — OXYCODONE-ACETAMINOPHEN 5-325 MG PO TABS
1.0000 | ORAL_TABLET | ORAL | Status: DC | PRN
  Administered 2019-10-23 – 2019-10-24 (×4): 1 via ORAL
  Filled 2019-10-22 (×4): qty 1

## 2019-10-22 MED ORDER — PRENATAL MULTIVITAMIN CH
1.0000 | ORAL_TABLET | Freq: Every day | ORAL | Status: DC
  Administered 2019-10-22 – 2019-10-24 (×3): 1 via ORAL
  Filled 2019-10-22 (×3): qty 1

## 2019-10-22 MED ORDER — LACTATED RINGERS IV BOLUS
2000.0000 mL | Freq: Once | INTRAVENOUS | Status: AC
  Administered 2019-10-22: 2000 mL via INTRAVENOUS

## 2019-10-22 MED ORDER — IBUPROFEN 200 MG PO TABS
400.0000 mg | ORAL_TABLET | Freq: Once | ORAL | Status: AC
  Administered 2019-10-22: 400 mg via ORAL
  Filled 2019-10-22: qty 2

## 2019-10-22 MED ORDER — SODIUM CHLORIDE 0.9 % IV SOLN
2.0000 g | Freq: Four times a day (QID) | INTRAVENOUS | Status: AC
  Administered 2019-10-22 – 2019-10-24 (×8): 2 g via INTRAVENOUS
  Filled 2019-10-22 (×9): qty 2

## 2019-10-22 MED ORDER — IBUPROFEN 600 MG PO TABS
600.0000 mg | ORAL_TABLET | Freq: Four times a day (QID) | ORAL | Status: DC
  Administered 2019-10-22 – 2019-10-24 (×9): 600 mg via ORAL
  Filled 2019-10-22 (×3): qty 1
  Filled 2019-10-22: qty 3
  Filled 2019-10-22 (×6): qty 1

## 2019-10-22 MED ORDER — IOHEXOL 300 MG/ML  SOLN
100.0000 mL | Freq: Once | INTRAMUSCULAR | Status: AC | PRN
  Administered 2019-10-22: 100 mL via INTRAVENOUS

## 2019-10-22 MED ORDER — DOXYCYCLINE HYCLATE 100 MG PO TABS
100.0000 mg | ORAL_TABLET | Freq: Two times a day (BID) | ORAL | Status: DC
  Administered 2019-10-22 – 2019-10-24 (×5): 100 mg via ORAL
  Filled 2019-10-22 (×5): qty 1

## 2019-10-22 MED ORDER — FENTANYL CITRATE (PF) 100 MCG/2ML IJ SOLN
100.0000 ug | Freq: Once | INTRAMUSCULAR | Status: AC
  Administered 2019-10-22: 100 ug via INTRAVENOUS
  Filled 2019-10-22: qty 2

## 2019-10-22 NOTE — ED Provider Notes (Signed)
.  Critical Care Performed by: Zadie Rhine, MD Authorized by: Zadie Rhine, MD   Critical care provider statement:    Critical care time (minutes):  35   Critical care start time:  10/22/2019 12:40 AM   Critical care end time:  10/22/2019 1:15 AM   Critical care time was exclusive of:  Separately billable procedures and treating other patients   Critical care was necessary to treat or prevent imminent or life-threatening deterioration of the following conditions:  Sepsis   Critical care was time spent personally by me on the following activities:  Discussions with consultants, evaluation of patient's response to treatment, examination of patient, re-evaluation of patient's condition, pulse oximetry, ordering and review of laboratory studies and ordering and performing treatments and interventions   I assumed direction of critical care for this patient from another provider in my specialty: yes        Zadie Rhine, MD 10/22/19 [phone]

## 2019-10-22 NOTE — ED Provider Notes (Signed)
Ultrasound rules out torsion.  Ultrasound reading more consistent with hemorrhagic cyst.  Discussed the case with Dr. Jolayne Panther with OB/GYN.  If patient's pain is improved, and no vomiting, she can be discharged with doxycycline and Flagyl for 2 weeks and follow-up as an outpatient in 2 weeks as this is likely PID Patient is awake alert in no acute distress.  She is agreeable with this plan.   Zadie Rhine, MD 10/22/19 [phone]

## 2019-10-22 NOTE — ED Provider Notes (Signed)
I assumed care in signout to follow-up on imaging.  CT reveals TOA versus ovarian mass versus ovarian torsion. Patient is febrile, favor TOA at this time. Discussed the case with Dr. Jolayne Panther with OB/GYN. Patient will need emergent ultrasound which is not available at this facility Patient will be emergently transferred to the [facility] emergency department for pelvic ultrasound to rule out torsion.  At that point patient can be admitted to the gynecology service.  Discussed the case with Dr. Judd Lien in the ER at [facility]ark, MD 10/22/19 [phone]

## 2019-10-22 NOTE — H&P (Addendum)
Faculty Practice Obstetrics and Gynecology Attending History and Physical  Ariel Lutz is a 24 y.o. G0 with LMP 8/20 to present who presented to [facility] emergency department today for evaluation of 3-4 days of right lower quadrant pain.  Pt states the pain is described as "tender" and it is constant.  She has noted a fever, but denies chills.  The patient denies nausea and vomiting.  IV pain medication improved the pain.  Urination and sitting along with direct pressure to the RLQ made the pain worse.  During the evaluation a possible early TOA was noted on ultrasound and CT scan.  Pt was started on IV and oral antibiotics before being transferred to [facility] for further gyn care..    Past Medical History:  Diagnosis Date  . Asthma   . Ovarian cyst    Past Surgical History:  Procedure Laterality Date  . TUBAL LIGATION Right    To fix an ovarian cyst   OB History  No obstetric history on file.  Patient denies any other pertinent gynecologic issues.  No current facility-administered medications on file prior to encounter.   Current Outpatient Medications on File Prior to Encounter  Medication Sig Dispense Refill  . acetaminophen (TYLENOL) 325 MG tablet Take 650 mg by mouth every 6 (six) hours as needed for mild pain or moderate pain.    Marland Kitchen albuterol (PROVENTIL HFA;VENTOLIN HFA) 108 (90 Base) MCG/ACT inhaler Inhale 1-2 puffs into the lungs every 6 (six) hours as needed for wheezing or shortness of breath. 1 Inhaler 0  . ibuprofen (ADVIL,MOTRIN) 200 MG tablet Take 200-400 mg by mouth every 6 (six) hours as needed for headache or mild pain.    Marland Kitchen ondansetron (ZOFRAN) 4 MG tablet Take 1 tablet (4 mg total) by mouth every 6 (six) hours. (Patient not taking: Reported on 10/12/2018) 12 tablet 0  . ranitidine (ZANTAC) 150 MG capsule Take 1 capsule (150 mg total) by mouth daily. (Patient not taking: Reported on 06/09/2017) 5 capsule 0   No Known Allergies  Social History:   reports  that she has never smoked. She has never used smokeless tobacco. She reports that she does not drink alcohol and does not use drugs. No family history on file.  Review of Systems: Pertinent items noted in HPI and remainder of comprehensive ROS otherwise negative.  PHYSICAL EXAM: Blood pressure 139/65, pulse (!) 106, temperature 99.2 F (37.3 C), temperature source Oral, resp. rate 18, height 5\' 7"  (1.702 m), weight 111.6 kg, last menstrual period 10/19/2019, SpO2 99 %.  Tmax 102.1 at 0128 8/23. CONSTITUTIONAL: Well-developed, well-nourished female in no acute distress.  HENT:  Normocephalic, atraumatic, External right and left ear normal. Oropharynx is clear and moist EYES: Conjunctivae and EOM are normal. Pupils are equal, round, and reactive to light. No scleral icterus.  NECK: Normal range of motion, supple, no masses SKIN: Skin is warm and dry. No rash noted. Not diaphoretic. No erythema. No pallor. NEUROLOGIC: Alert and oriented to person, place, and time. Normal reflexes, muscle tone coordination. No cranial nerve deficit noted. PSYCHIATRIC: Normal mood and affect. Normal behavior. Normal judgment and thought content. CARDIOVASCULAR: Normal heart rate noted, regular rhythm RESPIRATORY: Effort and breath sounds normal, no problems with respiration noted ABDOMEN: Soft, obese, tender in RLQ, nondistended PELVIC: Normal appearing external genitalia; normal appearing vaginal mucosa and cervix.  Normal appearing discharge.  Normal uterine size, mild cervical motion tenderness, mild/moderate right adnexal pain, light menstrual bleeding MUSCULOSKELETAL: Normal range of motion. No  tenderness.  No cyanosis, clubbing, or edema.  2+ distal pulses.  Labs: Results for orders placed or performed during the hospital encounter of 10/21/19 (from the past 336 hour(s))  Lipase, blood   Collection Time: 10/21/19  7:27 PM  Result Value Ref Range   Lipase 20 11 - 51 U/L  Comprehensive metabolic panel    Collection Time: 10/21/19  7:27 PM  Result Value Ref Range   Sodium 136 135 - 145 mmol/L   Potassium 3.7 3.5 - 5.1 mmol/L   Chloride 103 98 - 111 mmol/L   CO2 24 22 - 32 mmol/L   Glucose, Bld 109 (H) 70 - 99 mg/dL   BUN 7 6 - 20 mg/dL   Creatinine, Ser 7.42 0.44 - 1.00 mg/dL   Calcium 8.6 (L) 8.9 - 10.3 mg/dL   Total Protein 8.2 (H) 6.5 - 8.1 g/dL   Albumin 3.6 3.5 - 5.0 g/dL   AST 13 (L) 15 - 41 U/L   ALT 17 0 - 44 U/L   Alkaline Phosphatase 64 38 - 126 U/L   Total Bilirubin 0.2 (L) 0.3 - 1.2 mg/dL   GFR calc non Af Amer >60 >60 mL/min   GFR calc Af Amer >60 >60 mL/min   Anion gap 9 5 - 15  CBC   Collection Time: 10/21/19  7:27 PM  Result Value Ref Range   WBC 12.3 (H) 4.0 - 10.5 K/uL   RBC 4.29 3.87 - 5.11 MIL/uL   Hemoglobin 9.4 (L) 12.0 - 15.0 g/dL   HCT 59.5 (L) 36 - 46 %   MCV 75.8 (L) 80.0 - 100.0 fL   MCH 21.9 (L) 26.0 - 34.0 pg   MCHC 28.9 (L) 30.0 - 36.0 g/dL   RDW 63.8 (H) 75.6 - 43.3 %   Platelets 426 (H) 150 - 400 K/uL   nRBC 0.0 0.0 - 0.2 %  Urinalysis, Routine w reflex microscopic Urine, Clean Catch   Collection Time: 10/21/19  7:27 PM  Result Value Ref Range   Color, Urine YELLOW YELLOW   APPearance CLEAR CLEAR   Specific Gravity, Urine 1.020 1.005 - 1.030   pH 5.0 5.0 - 8.0   Glucose, UA NEGATIVE NEGATIVE mg/dL   Hgb urine dipstick NEGATIVE NEGATIVE   Bilirubin Urine NEGATIVE NEGATIVE   Ketones, ur NEGATIVE NEGATIVE mg/dL   Protein, ur NEGATIVE NEGATIVE mg/dL   Nitrite NEGATIVE NEGATIVE   Leukocytes,Ua NEGATIVE NEGATIVE  I-Stat beta hCG blood, ED   Collection Time: 10/21/19  7:40 PM  Result Value Ref Range   I-stat hCG, quantitative <5.0 <5 mIU/mL   Comment 3          GC/Chlamydia probe amp   Collection Time: 10/21/19  9:52 PM  Result Value Ref Range   Neisseria Gonorrhea Negative    Chlamydia Positive (A)    Comment Normal Reference Ranger Chlamydia - Negative    Comment      Normal Reference Range Neisseria Gonorrhea - Negative  RPR    Collection Time: 10/21/19 10:54 PM  Result Value Ref Range   RPR Ser Ql NON REACTIVE NON REACTIVE  HIV Antibody (routine testing w rflx)   Collection Time: 10/21/19 10:54 PM  Result Value Ref Range   HIV Screen 4th Generation wRfx Non Reactive Non Reactive  Wet prep, genital   Collection Time: 10/22/19 12:35 AM   Specimen: Cervix  Result Value Ref Range   Yeast Wet Prep HPF POC NONE SEEN NONE SEEN   Trich, Wet Prep  NONE SEEN NONE SEEN   Clue Cells Wet Prep HPF POC PRESENT (A) NONE SEEN   WBC, Wet Prep HPF POC MODERATE (A) NONE SEEN   Sperm NONE SEEN   SARS Coronavirus 2 by RT PCR (hospital order, performed in [facility] hospital lab) Nasopharyngeal Nasopharyngeal Swab   Collection Time: 10/22/19  1:27 AM   Specimen: Nasopharyngeal Swab  Result Value Ref Range   SARS Coronavirus 2 NEGATIVE NEGATIVE    Imaging Studies: CT ABDOMEN PELVIS W CONTRAST  Addendum Date: 10/22/2019   ADDENDUM REPORT: 10/22/2019 00:48 ADDENDUM: These results were called by telephone at the time of interpretation on 10/22/2019 at 12:47 am to provider JON KNAPP , who verbally acknowledged these results. Electronically Signed   By: Helyn Numbers MD   On: 10/22/2019 00:48   Result Date: 10/22/2019 CLINICAL DATA:  Right lower quadrant abdominal pain EXAM: CT ABDOMEN AND PELVIS WITH CONTRAST TECHNIQUE: Multidetector CT imaging of the abdomen and pelvis was performed using the standard protocol following bolus administration of intravenous contrast. CONTRAST:  OMNIPAQUE IOHEXOL 300 MG/ML  SOLN COMPARISON:  02/14/2015 FINDINGS: Lower chest: The visualized lung bases are clear bilaterally. The visualized heart and pericardium are unremarkable peer Hepatobiliary: Mild hepatic steatosis. Gallbladder unremarkable. No intra or extrahepatic biliary ductal dilation. Pancreas: Unremarkable Spleen: Unremarkable Adrenals/Urinary Tract: The adrenal glands are unremarkable. The kidneys are normal. Right adnexal mass results in  mass effect upon the bladder, however, the bladder is otherwise unremarkable. Stomach/Bowel: Stomach, small bowel, and large bowel are unremarkable. The appendix is seen within the retrocecal region and is normal. No free intraperitoneal gas or fluid. Vascular/Lymphatic: The abdominal vasculature is normal. No pathologic adenopathy within the abdomen and pelvis. Reproductive: The right ovary is markedly enlarged measuring at least 5.8 by 9.0 x 9.3 cm in greatest dimension on coronal image # 76 and axial image # 70. There is infiltrative change within the adjacent adnexal soft tissues. Together, the findings could reflect changes of ovarian torsion, a a ovarian mass, or a tubo-ovarian abscess. This is not optimally assessed on this examination. The uterus and left adnexa are unremarkable. Other: The rectum is unremarkable peer Musculoskeletal: Osseous structures are unremarkable. IMPRESSION: 1. The right ovary is markedly enlarged measuring at least 5.8 x 9.0 x 9.3 cm in greatest dimension. There is infiltrative change within the adjacent adnexal soft tissues. Together, the findings could reflect changes of ovarian torsion, a primary ovarian mass, or a tubo-ovarian abscess. This is not optimally assessed on this examination. Further evaluation with transabdominal and transvaginal pelvic ultrasound is recommended. 2. Normal appendix. 3. Mild hepatic steatosis. Electronically Signed: By: Helyn Numbers MD On: 10/22/2019 00:40   US PELVIC COMPLETE W TRANSVAGINAL AND TORSION R/O  Result Date: 10/22/2019 CLINICAL DATA:  23 year old female with right lower quadrant/right pelvic pain. EXAM: TRANSABDOMINAL AND TRANSVAGINAL ULTRASOUND OF PELVIS DOPPLER ULTRASOUND OF OVARIES TECHNIQUE: Both transabdominal and transvaginal ultrasound examinations of the pelvis were performed. Transabdominal technique was performed for global imaging of the pelvis including uterus, ovaries, adnexal regions, and pelvic cul-de-sac. It was  necessary to proceed with endovaginal exam following the transabdominal exam to visualize the endometrium and ovaries. Color and duplex Doppler ultrasound was utilized to evaluate blood flow to the ovaries. COMPARISON:  CT abdomen pelvis dated 10/22/2019 FINDINGS: Uterus Measurements: 6.7 x 3.2 x 3.6 cm = volume: 40 mL. No fibroids or other mass visualized. Endometrium Thickness: 5 mm.  No focal abnormality visualized. Right ovary The right ovary is poorly visualized. There  is a multi septated and multi-cystic mass with no definite internal vascularity noted in the region of the right adnexa. A soft tissue adjacent to this multi cystic mass may represent the ovarian tissue. The combined dimensions of the ovarian and cystic structure measure 9.3 x 5.5 x 5.9 cm. This may represent a hemorrhagic cyst within the right ovary. Doppler images demonstrate arterial and venous flow to the solid tissue, presumably the right ovary. Left ovary Not seen. Other findings No abnormal free fluid. IMPRESSION: 1. Unremarkable uterus and endometrium. 2. Poor visualization of the right ovary. Probable hemorrhagic cyst within the right ovary. 3. Nonvisualization of the left ovary. Electronically Signed   By: Elgie CollardArash  Radparvar M.D.   On: 10/22/2019 02:58    Assessment: Active Problems:   PID (acute pelvic inflammatory disease)   Pelvic inflammatory disease (PID)   Plan: Admit to [facility]  IV cefoxitin and oral doxycycline Monitor WBC count and temperature curve Continue antibiotics Regular diet Full evaluation was 30 minutes with discussion of treatment plan  Mariel AloeLawrence Kieren Ricci, MD, FACOG Obstetrician & Gynecologist, [facility], [facility]

## 2019-10-22 NOTE — ED Notes (Addendum)
Attempted to call report, nurse unable to take it at this time.

## 2019-10-22 NOTE — Progress Notes (Signed)
Called office to get in touch with Dr. Jolayne Panther, speaking with answering service to notify.

## 2019-10-22 NOTE — ED Notes (Signed)
Assumed care at this time. Pt stable and ambulating to the bathroom independently

## 2019-10-22 NOTE — ED Notes (Addendum)
U/s at bedside

## 2019-10-22 NOTE — Progress Notes (Signed)
Pt admitted to [facility] from [facility] from [facility].  Secure chatted dr. Jolayne Panther and called answering service to notify that pt is here in [facility].  Regular diet ordered for pt.Pt is alert and oriented and was shown the room and dept and how to use call light.

## 2019-10-22 NOTE — Plan of Care (Signed)
  Problem: Education: Goal: Knowledge of General Education information will improve Description Including pain rating scale, medication(s)/side effects and non-pharmacologic comfort measures Outcome: Progressing   

## 2019-10-22 NOTE — ED Provider Notes (Signed)
After further discussion with Dr. Jolayne Panther, she would like to admit the patient to the gynecology service at [facility].  It is okay for patient to eat.   Zadie Rhine, MD 10/22/19 [phone]

## 2019-10-22 NOTE — ED Provider Notes (Signed)
Correction-ultrasound is available tonight.  Will perform pelvic ultrasound and reassess   Ariel Rhine, MD 10/22/19 [phone]

## 2019-10-23 LAB — CBC
HCT: 28.9 % — ABNORMAL LOW (ref 36.0–46.0)
Hemoglobin: 8.2 g/dL — ABNORMAL LOW (ref 12.0–15.0)
MCH: 21.6 pg — ABNORMAL LOW (ref 26.0–34.0)
MCHC: 28.4 g/dL — ABNORMAL LOW (ref 30.0–36.0)
MCV: 76.3 fL — ABNORMAL LOW (ref 80.0–100.0)
Platelets: 343 10*3/uL (ref 150–400)
RBC: 3.79 MIL/uL — ABNORMAL LOW (ref 3.87–5.11)
RDW: 16.4 % — ABNORMAL HIGH (ref 11.5–15.5)
WBC: 10.3 10*3/uL (ref 4.0–10.5)
nRBC: 0 % (ref 0.0–0.2)

## 2019-10-23 MED ORDER — SODIUM CHLORIDE 0.9 % IV SOLN
510.0000 mg | Freq: Once | INTRAVENOUS | Status: AC
  Administered 2019-10-23: 510 mg via INTRAVENOUS
  Filled 2019-10-23: qty 17

## 2019-10-23 NOTE — TOC Initial Note (Signed)
Transition of Care [facility]) - Initial/Assessment Note    Patient Details  Name: Ariel Lutz MRN: [id number] Date of Birth: [birth year]  [facility]) CM/SW Contact:    Kingsley Plan, RN Phone Number: 10/23/2019, 10:41 AM  Clinical Narrative:                 Confirmed face sheet information. From home with SO   Patient interested in establishing care at [facility].   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   Patient agreeable to use [facility]. Will await discharge prescriptions and enter patient in [facility] program. Patient can afford MATCH co pay of $3 per prescription.   Expected Discharge Plan: Home/Self Care Barriers to Discharge: Continued Medical Work up   Patient Goals and CMS Choice Patient states their goals for this hospitalization and ongoing recovery are:: to return to home CMS Medicare.gov Compare Post Acute Care list provided to:: Patient Choice offered to / list presented to : NA  Expected Discharge Plan and Services Expected Discharge Plan: Home/Self Care In-house Referral: Financial Counselor Discharge Planning Services: Follow-up appt scheduled, MATCH Program, Medication Assistance, Indigent Health Clinic   Living arrangements for the past 2 months: Apartment                 DME Arranged: N/A DME Agency: NA       HH Arranged: NA          Prior Living Arrangements/Services Living arrangements for the past 2 months: Apartment Lives with:: Significant Other Patient language and need for interpreter reviewed:: Yes Do you feel safe going back to the place where you live?: Yes      Need for Family Participation in Patient Care: Yes (Comment) Care giver support system in place?: Yes (comment)   Criminal Activity/Legal Involvement Pertinent to Current Situation/Hospitalization: No - Comment as needed  Activities of Daily Living Home Assistive Devices/Equipment: Contact lenses, Eyeglasses ADL  Screening (condition at time of admission) Patient's cognitive ability adequate to safely complete daily activities?: Yes Is the patient deaf or have difficulty hearing?: No Does the patient have difficulty seeing, even when wearing glasses/contacts?: No Does the patient have difficulty concentrating, remembering, or making decisions?: No Patient able to express need for assistance with ADLs?: Yes Does the patient have difficulty dressing or bathing?: No Independently performs ADLs?: Yes (appropriate for developmental age) Does the patient have difficulty walking or climbing stairs?: No Weakness of Legs: None Weakness of Arms/Hands: None  Permission Sought/Granted   Permission granted to share information with : No              Emotional Assessment Appearance:: Appears stated age Attitude/Demeanor/Rapport: Engaged Affect (typically observed): Accepting Orientation: : Oriented to Self, Oriented to Place, Oriented to  Time, Oriented to Situation Alcohol / Substance Use: Not Applicable Psych Involvement: No (comment)  Admission diagnosis:  PID (acute pelvic inflammatory disease) [N73.0] Cyst of right ovary [N83.201] Pelvic pain [R10.2] Pelvic inflammatory disease (PID) [N73.9] Patient Active Problem List   Diagnosis Date Noted  . PID (acute pelvic inflammatory disease) 10/22/2019  . Pelvic inflammatory disease (PID) 10/22/2019   PCP:  Patient, No Pcp Per Pharmacy:   [id number] [place], [place] - [address] AT [facility] [address] [place] [address] Phone: [phone] Fax: [phone]  [facility] [place], [place] - [address] [place]  [place] [address] Phone: [phone] Fax: [phone]     Social Determinants of Health (SDOH) Interventions    Readmission Risk Interventions No flowsheet data found.

## 2019-10-23 NOTE — Progress Notes (Signed)
    Gynecology Progress Note  Admission Date: 10/21/2019 Current Date: 10/23/2019 11:21 AM  Ariel Lutz is a 24 y.o.  HD#2 admitted for PID.   History complicated by: Patient Active Problem List   Diagnosis Date Noted  . PID (acute pelvic inflammatory disease) 10/22/2019  . Pelvic inflammatory disease (PID) 10/22/2019    Subjective:  She is feeling somewhat better this am. Pain was 9/10 on admission, now 6/10. Still in RLQ. Denies nausea/vomiting. Voiding without issue. Denies other complaints.   Objective:   Vitals:   10/22/19 2044 10/23/19 0048 10/23/19 0119 10/23/19 0609  BP: 139/65  139/63 127/66  Pulse: (!) 106  (!) 104 90  Resp: 18 18 18 18   Temp: 99.2 F (37.3 C)  98.4 F (36.9 C) (!) 97.4 F (36.3 C)  TempSrc: Oral  Oral Oral  SpO2: 99%  98% 98%  Weight:      Height:        Physical exam: BP 127/66 (BP Location: Left Wrist)   Pulse 90   Temp (!) 97.4 F (36.3 C) (Oral)   Resp 18   Ht 5\' 7"  (1.702 m)   Wt 111.6 kg   LMP 10/19/2019   SpO2 98%   BMI 38.53 kg/m  CONSTITUTIONAL: Well-developed, well-nourished female in no acute distress.  HENT:  Normocephalic, atraumatic, External right and left ear normal. Oropharynx is clear and moist EYES: Conjunctivae and EOM are normal. Pupils are equal, round, and reactive to light. No scleral icterus.  NECK: Normal range of motion, supple, no masses.  Normal thyroid.  SKIN: Skin is warm and dry. No rash noted. Not diaphoretic. No erythema. No pallor. NEUROLOGIC: Alert and oriented to person, place, and time. Normal reflexes, muscle tone coordination. No cranial nerve deficit noted. PSYCHIATRIC: Normal mood and affect. Normal behavior. Normal judgment and thought content. CARDIOVASCULAR: Normal heart rate noted RESPIRATORY: Effort normal, no problems with respiration noted. ABDOMEN: Soft, no distention noted. Moderate tenderness in RLQ  PELVIC: deferred MUSCULOSKELETAL: Normal range of motion. No tenderness.  No  cyanosis, clubbing, or edema.    UOP: voiding spontaneously  Labs  Recent Labs  Lab 10/21/19 1927 10/23/19 0452  WBC 12.3* 10.3  HGB 9.4* 8.2*  HCT 32.5* 28.9*  PLT 426* 343     Assessment & Plan:   Patient is 24 y.o. HD#2 admitted for parenteral treatment of PID. Now 24 hrs IV antibiotics in with improvement in pain. Will plan for 48 hours IV antibiotics, transition to PO tomorrow and discharge home as long as she continues to improve clinically. She is doing well. Reviewed plan with patient, she is agreeable   Cont IV antibiotics +CT, has been treated feraheme today for Hgb 8.2, MCV 78 Home tomorrow likely   K. 10/25/19, M.D. Attending Center for [address])

## 2019-10-24 DIAGNOSIS — A749 Chlamydial infection, unspecified: Secondary | ICD-10-CM

## 2019-10-24 DIAGNOSIS — R102 Pelvic and perineal pain: Secondary | ICD-10-CM | POA: Diagnosis present

## 2019-10-24 MED ORDER — DOXYCYCLINE HYCLATE 100 MG PO TABS: 100.0000 mg | ORAL_TABLET | Freq: Two times a day (BID) | ORAL | 0 refills | Status: DC

## 2019-10-24 MED ORDER — OXYCODONE-ACETAMINOPHEN 5-325 MG PO TABS
1.0000 | ORAL_TABLET | ORAL | 0 refills | Status: DC | PRN
Start: 2019-10-24 — End: 2019-11-28

## 2019-10-24 MED FILL — OXYCODONE-APAP 5-325MG: 5-325 | 3 days supply | Qty: 15 | Fill #0

## 2019-10-24 MED FILL — DOXYCYCLINE HYCLATE 100 MG: 100 | 12 days supply | Qty: 24 | Fill #0

## 2019-10-24 NOTE — Discharge Instructions (Signed)
Pelvic Inflammatory Disease  Pelvic inflammatory disease (PID) is caused by an infection in some or all of the female reproductive organs. The infection can be in the uterus, ovaries, fallopian tubes, or the surrounding tissues in the pelvis. PID can cause abdominal or pelvic pain that comes on suddenly (acute pelvic pain). PID is a serious infection because it can lead to lasting (chronic) pelvic pain or the inability to have children (infertility). What are the causes? This condition is most often caused by bacteria that is spread during sexual contact. It can also be caused by a bacterial infection of the vagina (bacterial vaginosis) that is not spread by sexual contact. This condition occurs when the infection is not treated and the bacteria travel upward from the vagina or cervix into the reproductive organs. Bacteria may also be introduced into the reproductive organs following:  The birth of a baby.  A miscarriage.  An abortion.  Major pelvic surgery.  The insertion of an intrauterine device (IUD).  A sexual assault. What increases the risk? You are more likely to develop this condition if you:  Are younger than 23 years of age.  Are sexually active at a young age.  Have a history of STI (sexually transmitted infection) or PID.  Do not regularly use barrier contraception methods, such as condoms.  Have multiple sexual partners.  Have sex with someone who has symptoms of an STI.  Use a vaginal douche.  Have recently had an IUD inserted. What are the signs or symptoms? Symptoms of this condition include:  Abdominal or pelvic pain.  Fever.  Chills.  Abnormal vaginal discharge.  Abnormal uterine bleeding.  Unusual pain shortly after the end of a menstrual period.  Painful urination.  Pain with sex.  Nausea and vomiting. How is this diagnosed? This condition is diagnosed based on a pelvic exam and medical history. A pelvic exam can reveal signs of  infection, inflammation, and discharge in the vagina and the surrounding tissues. It can also help to identify painful areas. You may also have tests, such as:  Lab tests, including a pregnancy test, blood tests, and a urine test.  Culture tests of the vagina and cervix to check for an STI.  Ultrasound.  A laparoscopic procedure to look inside the pelvis.  Examination of vaginal discharge under a microscope. How is this treated? This condition may be treated with:  Antibiotic medicines taken by mouth (orally). For more severe cases, antibiotics may be given through an IV at the hospital.  Surgery. This is rare. Surgery may be needed if other treatments do not help.  Efforts to stop the spread of the infection. Sexual partners may need to be treated if the infection is caused by an STI. It may take weeks until you are completely well. If you are diagnosed with PID, you should also be checked for HIV (human immunodeficiency virus). Your health care provider may test you for infection again 3 months after treatment. You should not have unprotected sex. Follow these instructions at home:  Take over-the-counter and prescription medicines only as told by your health care provider.  If you were prescribed an antibiotic medicine, take it as told by your health care provider. Do not stop using the antibiotic even if you start to feel better.  Do not have sex until treatment is completed or as told by your health care provider. If PID is confirmed, your recent sexual partners will need treatment, especially if you had unprotected sex.  Keep all   follow-up visits as told by your health care provider. This is important. Contact a health care provider if:  You have increased or abnormal vaginal discharge.  Your pain does not improve.  You vomit.  You have a fever.  You cannot tolerate your medicines.  Your partner has an STI.  You have pain when you urinate. Get help right away if:   You have increased abdominal or pelvic pain.  You have chills.  Your symptoms are not better in 72 hours with treatment. Summary  Pelvic inflammatory disease (PID) is caused by an infection in some or all of the female reproductive organs.  PID is a serious infection because it can lead to lasting (chronic) pelvic pain or the inability to have children (infertility).  This infection is usually treated with antibiotic medicines.  Do not have sex until treatment is completed or as told by your health care provider. This information is not intended to replace advice given to you by your health care provider. Make sure you discuss any questions you have with your health care provider. Document Revised: 11/03/2017 Document Reviewed: 11/08/2017 Elsevier Patient Education  2020 [employer]  

## 2019-10-24 NOTE — Progress Notes (Signed)
  Progress Note   Date: 10/23/2019  Patient Name: Ariel Lutz        MRN#: [id number]  Clarification of diagnosis:  Iron deficiency anemia  Pt had no bleeding or any surgical procedures.  She was anemic at time of admission.  Most likely source would be iron deficiency anemia.

## 2019-10-24 NOTE — Discharge Summary (Signed)
Physician Discharge Summary  Patient ID: Ariel Lutz MRN: [id number] DOB/AGE: 1995/10/28 24 y.o.  Admit date: 10/21/2019 Discharge date: 10/24/2019  Admission Diagnoses: PID  Discharge Diagnoses:  Principal Problem:   Pelvic inflammatory disease (PID) Active Problems:   PID (acute pelvic inflammatory disease)   Pelvic pain   Chlamydia   Discharged Condition: good  Hospital Course: Please see HPI dated 10/21/2019 for full details. Briefly, this is a 24 y.o. No obstetric history on file. female admitted for presumed PID. She received 48 hours IV antibiotics and improved. Pain improved throughout her course. She was diagnosed with chlamydia and treated with azithromycin. Her hospital course was uncomplicated. By HD#3 she was eating without nausea/vomiting, voiding spontaneously without issue, passing flatus. Her pain was well controlled on PO pain meds and she was ambulating without difficulty.She was discharged home HD#3 in good condition. She will f/u in office  Medical history significant for na.  Physical Exam:  BP (!) 121/48 (BP Location: Left Arm)   Pulse 88   Temp 97.7 F (36.5 C) (Oral)   Resp 20   Ht 5\' 7"  (1.702 m)   Wt 111.6 kg   LMP 10/19/2019   SpO2 100%   BMI 38.53 kg/m  General: alert, oriented, cooperative Chest: normal respiratory effort Heart: RRR  Abdomen: soft, appropriately tender to palpation  DVT Evaluation: no evidence of DVT Extremities: no edema, no calf tenderness   Labs: Lab Results  Component Value Date   WBC 10.3 10/23/2019   HGB 8.2 (L) 10/23/2019   HCT 28.9 (L) 10/23/2019   MCV 76.3 (L) 10/23/2019   PLT 343 10/23/2019   CMP Latest Ref Rng & Units 10/21/2019  Glucose 70 - 99 mg/dL 10/23/2019)  BUN 6 - 20 mg/dL 7  Creatinine 564(P - 3.29 mg/dL 5.18  Sodium 8.41 - 660 mmol/L 136  Potassium 3.5 - 5.1 mmol/L 3.7  Chloride 98 - 111 mmol/L 103  CO2 22 - 32 mmol/L 24  Calcium 8.9 - 10.3 mg/dL 630)  Total Protein 6.5 - 8.1 g/dL 8.2(H)    Total Bilirubin 0.3 - 1.2 mg/dL 1.6(W)  Alkaline Phos 38 - 126 U/L 64  AST 15 - 41 U/L 13(L)  ALT 0 - 44 U/L 17   Disposition: Discharge disposition: 01-Home or Self Care      Discharge Instructions    Call MD for:  difficulty breathing, headache or visual disturbances   Complete by: As directed    Call MD for:  extreme fatigue   Complete by: As directed    Call MD for:  hives   Complete by: As directed    Call MD for:  persistant dizziness or light-headedness   Complete by: As directed    Call MD for:  persistant nausea and vomiting   Complete by: As directed    Call MD for:  redness, tenderness, or signs of infection (pain, swelling, redness, odor or green/yellow discharge around incision site)   Complete by: As directed    Call MD for:  severe uncontrolled pain   Complete by: As directed    Call MD for:  temperature >100.4   Complete by: As directed    Diet - low sodium heart healthy   Complete by: As directed    May shower / Bathe   Complete by: As directed      An After Visit Summary was printed and given to the patient. Allergies as of 10/24/2019   No Known Allergies     Medication  List    STOP taking these medications   ondansetron 4 MG tablet Commonly known as: ZOFRAN     TAKE these medications   acetaminophen 325 MG tablet Commonly known as: TYLENOL Take 650 mg by mouth every 6 (six) hours as needed for mild pain or moderate pain.   albuterol 108 (90 Base) MCG/ACT inhaler Commonly known as: VENTOLIN HFA Inhale 1-2 puffs into the lungs every 6 (six) hours as needed for wheezing or shortness of breath.   doxycycline 100 MG tablet Commonly known as: VIBRA-TABS Take 1 tablet (100 mg total) by mouth 2 (two) times daily.   ibuprofen 200 MG tablet Commonly known as: ADVIL Take 200-400 mg by mouth every 6 (six) hours as needed for headache or mild pain.   oxyCODONE-acetaminophen 5-325 MG tablet Commonly known as: PERCOCET/ROXICET Take 1-2 tablets by  mouth every 3 (three) hours as needed (moderate to severe pain (when tolerating fluids)).   ranitidine 150 MG capsule Commonly known as: ZANTAC Take 1 capsule (150 mg total) by mouth daily.       Follow-up Information    [facility]. Schedule an appointment as soon as possible for a visit.   Contact information: [address] [address] [phone]       [facility]. Schedule an appointment as soon as possible for a visit in 2 week(s).   Specialty: Obstetrics and Gynecology Contact information: [address] [place] [address] [phone]              Signed: Conan Bowens 10/24/2019, 12:51 PM

## 2019-10-24 NOTE — Progress Notes (Signed)
Patient discharged to home. Discharge instructions reviewed with patient by Endo RN on 6N to help. Patient verbalizes understanding of all discharge instructions including discharge medications and follow up MD visits.

## 2019-11-14 NOTE — Progress Notes (Signed)
Patient ID: Ariel Lutz, female   DOB: [birth year], 24 y.o.   MRN: [id number]   Virtual Visit via Telephone Note  I connected with Ariel Lutz on 11/28/19 at  8:50 AM EDT by telephone and verified that I am speaking with the correct person using two identifiers.   I discussed the limitations, risks, security and privacy concerns of performing an evaluation and management service by telephone and the availability of in person appointments. I also discussed with the patient that there may be a patient responsible charge related to this service. The patient expressed understanding and agreed to proceed.   PATIENT visit by telephone virtually in the context of Covid-19 pandemic. Patient location:  home My Location:  [facility] office Persons on the call:  Me and the patient    History of Present Illness: After hospitalization 8/22-8/25/2021 for PID.  She is feeling much better.  No abdominal pain/pelvic pain/fever/vomiting.  Partner was also treated.    PMH:  Anemia and asthma.    Not on iron.  Needs rf of inhaler which she only usues occasionally.    From discharge summary: Discharge Diagnoses:  Principal Problem:   Pelvic inflammatory disease (PID) Active Problems:   PID (acute pelvic inflammatory disease)   Pelvic pain   Chlamydia   Discharged Condition: good  Hospital Course: Please see HPI dated 10/21/2019 for full details. Briefly, this is a 24 y.o. No obstetric history on file. female admitted for presumed PID. She received 48 hours IV antibiotics and improved. Pain improved throughout her course. She was diagnosed with chlamydia and treated with azithromycin. Her hospital course was uncomplicated. By HD#3 she was eating without nausea/vomiting, voiding spontaneously without issue, passing flatus. Her pain was well controlled on PO pain meds and she was ambulating without difficulty.She was discharged home HD#3 in good condition. She will f/u in office     Observations/Objective:  NAD.  A&Ox3   Assessment and Plan: 1. PID (acute pelvic inflammatory disease) Resolved.  Safe sex practices reviewed  2. Mild asthma, unspecified whether complicated, unspecified whether persistent -albuterol sent  3. Anemia, unspecified type Resume iron - Ferrous Sulfate (IRON) 325 (65 Fe) MG TABS; Take 1 tablet (325 mg total) by mouth every morning.  Dispense: 100 tablet; Refill: 1  4. Hospital discharge follow-up Doing well  Follow Up Instructions: Assign PCP in 2 months   I discussed the assessment and treatment plan with the patient. The patient was provided an opportunity to ask questions and all were answered. The patient agreed with the plan and demonstrated an understanding of the instructions.   The patient was advised to call back or seek an in-person evaluation if the symptoms worsen or if the condition fails to improve as anticipated.  I provided 13 minutes of non-face-to-face time during this encounter.   Georgian Co, PA-C

## 2019-11-19 ENCOUNTER — Encounter: Payer: Self-pay | Admitting: Obstetrics and Gynecology

## 2019-11-28 ENCOUNTER — Ambulatory Visit: Payer: Self-pay | Attending: Physician Assistant | Admitting: Physician Assistant

## 2019-11-28 ENCOUNTER — Encounter: Payer: Self-pay | Admitting: Physician Assistant

## 2019-11-28 DIAGNOSIS — N730 Acute parametritis and pelvic cellulitis: Secondary | ICD-10-CM

## 2019-11-28 DIAGNOSIS — D649 Anemia, unspecified: Secondary | ICD-10-CM

## 2019-11-28 DIAGNOSIS — J45909 Unspecified asthma, uncomplicated: Secondary | ICD-10-CM

## 2019-11-28 DIAGNOSIS — Z09 Encounter for follow-up examination after completed treatment for conditions other than malignant neoplasm: Secondary | ICD-10-CM

## 2019-11-28 MED ORDER — IRON 325 (65 FE) MG PO TABS: 1.0000 | ORAL_TABLET | Freq: Every morning | ORAL | 1 refills | Status: DC

## 2019-11-28 MED ORDER — ALBUTEROL SULFATE HFA 108 (90 BASE) MCG/ACT IN AERS: 1.0000 | INHALATION_SPRAY | Freq: Four times a day (QID) | RESPIRATORY_TRACT | 2 refills | Status: DC | PRN

## 2019-11-28 MED FILL — FERROUS SULFATE 325 MG TAB: 325 (65 FE) | 90 days supply | Qty: 90 | Fill #0

## 2019-11-28 MED FILL — !VENTOLIN HFA INHALER: 108 (90 BAS | 25 days supply | Qty: 18 | Fill #0

## 2019-12-17 ENCOUNTER — Other Ambulatory Visit: Payer: Self-pay

## 2019-12-17 ENCOUNTER — Emergency Department (HOSPITAL_COMMUNITY)
Admission: EM | Admit: 2019-12-17 | Discharge: 2019-12-17 | Disposition: A | Discharge status: Home / Self Care | Payer: Self-pay | Attending: Emergency Medicine | Admitting: Emergency Medicine

## 2019-12-17 ENCOUNTER — Encounter (HOSPITAL_COMMUNITY): Payer: Self-pay | Admitting: Obstetrics and Gynecology

## 2019-12-17 ENCOUNTER — Other Ambulatory Visit (HOSPITAL_COMMUNITY): Payer: Self-pay | Admitting: Physician Assistant

## 2019-12-17 DIAGNOSIS — J039 Acute tonsillitis, unspecified: Secondary | ICD-10-CM | POA: Insufficient documentation

## 2019-12-17 DIAGNOSIS — Z20822 Contact with and (suspected) exposure to covid-19: Secondary | ICD-10-CM | POA: Insufficient documentation

## 2019-12-17 DIAGNOSIS — J45909 Unspecified asthma, uncomplicated: Secondary | ICD-10-CM | POA: Insufficient documentation

## 2019-12-17 LAB — RESPIRATORY PANEL BY RT PCR (FLU A&B, COVID)
Influenza A by PCR: NEGATIVE
Influenza B by PCR: NEGATIVE
SARS Coronavirus 2 by RT PCR: NEGATIVE

## 2019-12-17 LAB — GROUP A STREP BY PCR: Group A Strep by PCR: NOT DETECTED

## 2019-12-17 MED ORDER — AMOXICILLIN 500 MG PO CAPS: 500.0000 mg | ORAL_CAPSULE | Freq: Three times a day (TID) | ORAL | 0 refills | Status: DC

## 2019-12-17 MED FILL — !VENTOLIN HFA INHALER: 108 (90 BAS | 25 days supply | Qty: 18 | Fill #0

## 2019-12-17 MED FILL — FERROUS SULFATE 325 MG TAB: 325 (65 FE) | 90 days supply | Qty: 90 | Fill #0

## 2019-12-17 MED FILL — AMOXICILLIN 500 MG CAPSULE: 500 | 7 days supply | Qty: 21 | Fill #0

## 2019-12-17 NOTE — ED Triage Notes (Signed)
Patient presents to the ER for sore throat. Patient reports pain started x1 week ago.Repotrs she was around someone who had a sore throat but "he's better".  Patient denies any rashes, SOB, N/V/D.

## 2019-12-17 NOTE — Discharge Instructions (Signed)
Take the antibiotics as prescribed.  Take Tylenol and ibuprofen as needed for pain.  Do not take ibuprofen if you think you may be pregnant

## 2019-12-17 NOTE — ED Provider Notes (Signed)
[facility]-EMERGENCY DEPT Provider Note   CSN: [id number] Arrival date & time: 12/17/19  1017     History Chief Complaint  Patient presents with  . Sore Throat    Ariel Lutz is a 24 y.o. female with past history significant for PID who presents for evaluation of sore throat. Pain x1.5 weeks. Was around someone who had a sore throat over his resolved. Patient states she has a scratchy throat when she swallows. She has been able to tolerate p.o. intake at home. She denies headache, nausea vomiting, diarrhea, neck pain, neck stiffness, drooling, dysphagia, trismus, congestion, rhinorrhea, cough, sore throat, known Covid exposures, abdominal pain, rashes or lesions. Denies additional aggravating or alleviating factors. Has not take anything for symptoms.  History obtained from patient and past medical records. No interpreter used.  HPI     Past Medical History:  Diagnosis Date  . Asthma   . Ovarian cyst     Patient Active Problem List   Diagnosis Date Noted  . Pelvic pain 10/24/2019  . Chlamydia 10/24/2019  . PID (acute pelvic inflammatory disease) 10/22/2019  . Pelvic inflammatory disease (PID) 10/22/2019    Past Surgical History:  Procedure Laterality Date  . TUBAL LIGATION Right    To fix an ovarian cyst     OB History   No obstetric history on file.     No family history on file.  Social History   Tobacco Use  . Smoking status: Never Smoker  . Smokeless tobacco: Never Used  Vaping Use  . Vaping Use: Never used  Substance Use Topics  . Alcohol use: No  . Drug use: No    Home Medications Prior to Admission medications   Medication Sig Start Date End Date Taking? Authorizing Provider  acetaminophen (TYLENOL) 325 MG tablet Take 650 mg by mouth every 6 (six) hours as needed for mild pain or moderate pain.    [provider]  albuterol (VENTOLIN HFA) 108 (90 Base) MCG/ACT inhaler Inhale 1-2 puffs into the lungs every 6 (six)  hours as needed for wheezing or shortness of breath. 11/28/19   Sharon Seller, Marzella Schlein, PA-C  amoxicillin (AMOXIL) 500 MG capsule Take 1 capsule (500 mg total) by mouth 3 (three) times daily. 12/17/19   Persais Ethridge A, PA-C  Ferrous Sulfate (IRON) 325 (65 Fe) MG TABS Take 1 tablet (325 mg total) by mouth every morning. 11/28/19   Anders Simmonds, PA-C  ibuprofen (ADVIL,MOTRIN) 200 MG tablet Take 200-400 mg by mouth every 6 (six) hours as needed for headache or mild pain.    [provider]    Allergies    Patient has no known allergies.  Review of Systems   Review of Systems  Constitutional: Negative.   HENT: Positive for sore throat. Negative for congestion, drooling, ear discharge, ear pain, facial swelling, mouth sores, nosebleeds, postnasal drip, rhinorrhea, sinus pressure, sinus pain, sneezing, tinnitus, trouble swallowing and voice change.   Respiratory: Negative.   Cardiovascular: Negative.   Gastrointestinal: Negative.   Genitourinary: Negative.   Musculoskeletal: Negative.   Skin: Negative.   Neurological: Negative.   All other systems reviewed and are negative.   Physical Exam Updated Vital Signs BP (!) 160/79 (BP Location: Left Arm)   Pulse 73   Temp 98.7 F (37.1 C) (Oral)   Resp 18   Ht 5\' 7"  (1.702 m)   Wt 112 kg   LMP 11/12/2019   SpO2 99%   BMI 38.69 kg/m   Physical  Exam Vitals and nursing note reviewed.  Constitutional:      General: She is not in acute distress.    Appearance: She is not ill-appearing, toxic-appearing or diaphoretic.  HENT:     Head: Normocephalic and atraumatic.     Jaw: There is normal jaw occlusion.     Right Ear: Tympanic membrane, ear canal and external ear normal. There is no impacted cerumen. No hemotympanum. Tympanic membrane is not injected, scarred, perforated, erythematous, retracted or bulging.     Left Ear: Tympanic membrane, ear canal and external ear normal. There is no impacted cerumen. No hemotympanum. Tympanic  membrane is not injected, scarred, perforated, erythematous, retracted or bulging.     Ears:     Comments: No Mastoid tenderness.    Nose:     Comments: Clear rhinorrhea and congestion to bilateral nares.  No sinus tenderness.    Mouth/Throat:     Pharynx: Oropharynx is clear. Uvula midline.     Tonsils: Tonsillar exudate present. No tonsillar abscesses. 2+ on the right. 2+ on the left.     Comments: Posterior oropharyngeal erythema. 2+ tonsils bilaterally with exudate. Uvula midline without deviation.  No evidence of PTA or RPA.  No drooling, dysphasia or trismus.  Phonation normal. Neck:     Trachea: Trachea and phonation normal.     Meningeal: Brudzinski's sign and Kernig's sign absent.     Comments: No Neck stiffness or neck rigidity.  No meningismus.  No cervical lymphadenopathy. Cardiovascular:     Comments: No murmurs rubs or gallops. Pulmonary:     Comments: Clear to auscultation bilaterally without wheeze, rhonchi or rales.  No accessory muscle usage.  Able speak in full sentences. Abdominal:     Comments: Soft, nontender without rebound or guarding.  No CVA tenderness.  Musculoskeletal:     Comments: Moves all 4 extremities without difficulty.  Lower extremities without edema, erythema or warmth.  Skin:    Comments: Brisk capillary refill.  No rashes or lesions.  Neurological:     Mental Status: She is alert.     Comments: Ambulatory in department without difficulty.  Cranial nerves II through XII grossly intact.  No facial droop.  No aphasia.     ED Results / Procedures / Treatments   Labs (all labs ordered are listed, but only abnormal results are displayed) Labs Reviewed  GROUP A STREP BY PCR  RESPIRATORY PANEL BY RT PCR (FLU A&B, COVID)    EKG None  Radiology No results found.  Procedures Procedures (including critical care time)  Medications Ordered in ED Medications - No data to display  ED Course  I have reviewed the triage vital signs and the  nursing notes.  Pertinent labs & imaging results that were available during my care of the patient were reviewed by me and considered in my medical decision making (see chart for details).  24 year old presents for evaluation of sore throat. She is afebrile, nonseptic, non-ill-appearing. No neck stiffness or neck rigidity. No meningismus. Heart and lungs clear. Abdomen soft, nontender. No rashes or lesions. She does have posterior oropharyngeal erythema, 2+ tonsils bilaterally with exudate. Uvula midline. No drooling, dysphagia or trismus. No evidence of PTA or RPA. Phonation normal. She is tolerating p.o. intake. Patient strep and Covid test negative. Likely tonsillitis. Given length of symptoms will DC with antibiotics. Have low suspicion for mono, deep space infection at this time. Discussed symptomatic management at home. She will return for any worsening symptoms.  The patient has been  appropriately medically screened and/or stabilized in the ED. I have low suspicion for any other emergent medical condition which would require further screening, evaluation or treatment in the ED or require inpatient management.  Patient is hemodynamically stable and in no acute distress.  Patient able to ambulate in department prior to ED.  Evaluation does not show acute pathology that would require ongoing or additional emergent interventions while in the emergency department or further inpatient treatment.  I have discussed the diagnosis with the patient and answered all questions.  Pain is been managed while in the emergency department and patient has no further complaints prior to discharge.  Patient is comfortable with plan discussed in room and is stable for discharge at this time.  I have discussed strict return precautions for returning to the emergency department.  Patient was encouraged to follow-up with PCP/specialist refer to at discharge.    MDM Rules/Calculators/A&P                          Ariel Lutz was evaluated in Emergency Department on 12/17/2019 for the symptoms described in the history of present illness. She was evaluated in the context of the global COVID-19 pandemic, which necessitated consideration that the patient might be at risk for infection with the SARS-CoV-2 virus that causes COVID-19. Institutional protocols and algorithms that pertain to the evaluation of patients at risk for COVID-19 are in a state of rapid change based on information released by regulatory bodies including the CDC and federal and state organizations. These policies and algorithms were followed during the patient's care in the ED. Final Clinical Impression(s) / ED Diagnoses Final diagnoses:  Tonsillitis    Rx / DC Orders ED Discharge Orders         Ordered    amoxicillin (AMOXIL) 500 MG capsule  3 times daily        12/17/19 1233           Halynn Reitano A, PA-C 12/17/19 1233    Charlynne Pander, MD 12/18/19 1504

## 2019-12-19 ENCOUNTER — Encounter: Payer: Self-pay | Admitting: Obstetrics & Gynecology

## 2020-01-28 ENCOUNTER — Ambulatory Visit: Payer: Self-pay | Admitting: Family Medicine

## 2020-01-30 ENCOUNTER — Encounter: Payer: Self-pay | Admitting: Obstetrics and Gynecology

## 2020-01-30 ENCOUNTER — Other Ambulatory Visit: Payer: Self-pay

## 2020-01-30 ENCOUNTER — Ambulatory Visit (INDEPENDENT_AMBULATORY_CARE_PROVIDER_SITE_OTHER): Payer: Self-pay | Admitting: Obstetrics and Gynecology

## 2020-01-30 VITALS — BP 138/70 | HR 63

## 2020-01-30 DIAGNOSIS — N939 Abnormal uterine and vaginal bleeding, unspecified: Secondary | ICD-10-CM

## 2020-01-30 DIAGNOSIS — N730 Acute parametritis and pelvic cellulitis: Secondary | ICD-10-CM

## 2020-01-30 MED ORDER — NORGESTIMATE-ETH ESTRADIOL 0.25-35 MG-MCG PO TABS: 1.0000 | ORAL_TABLET | Freq: Every day | ORAL | 2 refills | Status: AC

## 2020-01-30 MED FILL — MONO-LINYAH 28 TABLET: 0.25-35 | 28 days supply | Qty: 28 | Fill #0

## 2020-01-30 NOTE — Progress Notes (Signed)
Free Pap Screening contact info given to patient   Addison Naegeli, RN  01/30/20

## 2020-01-30 NOTE — Progress Notes (Signed)
   GYNECOLOGY OFFICE FOLLOW UP NOTE  History:  24 y.o. G0 here today for follow up for PID admission. Pt completed meds and has been pain free ever since. She is wanting to be pregnant but has irregular periods, menses lasting 14 days with cycles lasting 30-90 days.  Past Medical History:  Diagnosis Date  . Asthma   . Ovarian cyst     Past Surgical History:  Procedure Laterality Date  . TUBAL LIGATION Right    To fix an ovarian cyst     Current Outpatient Medications:  .  acetaminophen (TYLENOL) 325 MG tablet, Take 650 mg by mouth every 6 (six) hours as needed for mild pain or moderate pain. (Patient not taking: Reported on 01/30/2020), Disp: , Rfl:  .  albuterol (VENTOLIN HFA) 108 (90 Base) MCG/ACT inhaler, Inhale 1-2 puffs into the lungs every 6 (six) hours as needed for wheezing or shortness of breath. (Patient not taking: Reported on 01/30/2020), Disp: 1 each, Rfl: 2 .  Ferrous Sulfate (IRON) 325 (65 Fe) MG TABS, Take 1 tablet (325 mg total) by mouth every morning. (Patient not taking: Reported on 01/30/2020), Disp: 100 tablet, Rfl: 1 .  ibuprofen (ADVIL,MOTRIN) 200 MG tablet, Take 200-400 mg by mouth every 6 (six) hours as needed for headache or mild pain. (Patient not taking: Reported on 01/30/2020), Disp: , Rfl:  .  norgestimate-ethinyl estradiol (ORTHO-CYCLEN) 0.25-35 MG-MCG tablet, Take 1 tablet by mouth daily., Disp: 28 tablet, Rfl: 2  The following portions of the patient's history were reviewed and updated as appropriate: allergies, current medications, past family history, past medical history, past social history, past surgical history and problem list.   Review of Systems:  Pertinent items noted in HPI and remainder of comprehensive ROS otherwise negative.   Objective:  Physical Exam BP 138/70   Pulse 63   LMP 01/16/2020 (Approximate)  CONSTITUTIONAL: Well-developed, well-nourished female in no acute distress.  HENT:  Normocephalic, atraumatic. External right and left  ear normal. Oropharynx is clear and moist EYES: Conjunctivae and EOM are normal. Pupils are equal, round, and reactive to light. No scleral icterus.  NECK: Normal range of motion, supple, no masses SKIN: Skin is warm and dry. No rash noted. Not diaphoretic. No erythema. No pallor. NEUROLOGIC: Alert and oriented to person, place, and time. Normal reflexes, muscle tone coordination. No cranial nerve deficit noted. PSYCHIATRIC: Normal mood and affect. Normal behavior. Normal judgment and thought content. CARDIOVASCULAR: Normal heart rate noted RESPIRATORY: Effort normal, no problems with respiration noted ABDOMEN: Soft, no distention noted.   PELVIC: deferred MUSCULOSKELETAL: Normal range of motion. No edema noted.  Labs and Imaging No results found.  Assessment & Plan:  1. PID (acute pelvic inflammatory disease) Now pain free Reviewed importance of regular STI testing and increased risk of PID in future  2. Abnormal uterine bleeding (AUB) - Patient has irregular periods, will start OCPs x 3 months to try to regulate - Return 3 months - plan to discuss PCOS workup (pt with no insurance currently)   Pt given info for pap clinic  Routine preventative health maintenance measures emphasized. Please refer to After Visit Summary for other counseling recommendations.   Return in about 3 months (around 04/29/2020) for Followup.  Total face-to-face time with patient: 16 minutes. Over 50% of encounter was spent on counseling and coordination of care.  Baldemar Lenis, M.D. Attending [facility] [employer])

## 2020-02-04 ENCOUNTER — Ambulatory Visit: Payer: Self-pay

## 2020-03-13 ENCOUNTER — Other Ambulatory Visit: Payer: Self-pay

## 2020-04-30 ENCOUNTER — Ambulatory Visit: Payer: Self-pay | Admitting: Obstetrics and Gynecology

## 2020-05-23 ENCOUNTER — Ambulatory Visit: Payer: Self-pay | Admitting: Obstetrics and Gynecology

## 2020-12-03 ENCOUNTER — Ambulatory Visit (HOSPITAL_COMMUNITY)
Admission: EM | Admit: 2020-12-03 | Discharge: 2020-12-03 | Disposition: A | Discharge status: Home / Self Care | Payer: Self-pay | Attending: Emergency Medicine | Admitting: Emergency Medicine

## 2020-12-03 ENCOUNTER — Encounter (HOSPITAL_COMMUNITY): Payer: Self-pay | Admitting: Emergency Medicine

## 2020-12-03 ENCOUNTER — Other Ambulatory Visit: Payer: Self-pay

## 2020-12-03 DIAGNOSIS — I10 Essential (primary) hypertension: Secondary | ICD-10-CM | POA: Insufficient documentation

## 2020-12-03 DIAGNOSIS — N760 Acute vaginitis: Secondary | ICD-10-CM | POA: Insufficient documentation

## 2020-12-03 DIAGNOSIS — E611 Iron deficiency: Secondary | ICD-10-CM | POA: Insufficient documentation

## 2020-12-03 DIAGNOSIS — N898 Other specified noninflammatory disorders of vagina: Secondary | ICD-10-CM | POA: Insufficient documentation

## 2020-12-03 DIAGNOSIS — R300 Dysuria: Secondary | ICD-10-CM | POA: Insufficient documentation

## 2020-12-03 LAB — POCT URINALYSIS DIPSTICK, ED / UC
Bilirubin Urine: NEGATIVE
Glucose, UA: NEGATIVE mg/dL
Ketones, ur: NEGATIVE mg/dL
Leukocytes,Ua: NEGATIVE
Nitrite: NEGATIVE
Protein, ur: NEGATIVE mg/dL
Specific Gravity, Urine: >=1.03 (ref 1.005–1.030)
Urobilinogen, UA: 0.2 mg/dL (ref 0.0–1.0)
pH: 5.5 (ref 5.0–8.0)

## 2020-12-03 LAB — HIV ANTIBODY (ROUTINE TESTING W REFLEX): HIV Screen 4th Generation wRfx: NONREACTIVE

## 2020-12-03 LAB — POC URINE PREG, ED: Preg Test, Ur: NEGATIVE

## 2020-12-03 MED ORDER — HYDROCHLOROTHIAZIDE 25 MG PO TABS
25.0000 mg | ORAL_TABLET | Freq: Every day | ORAL | 0 refills | Status: AC
Start: 2020-12-03 — End: 2021-03-03
  Filled 2020-12-03: qty 30, 30d supply, fill #0

## 2020-12-03 NOTE — Discharge Instructions (Addendum)
Your blood pressure is very elevated today.  Because your diastolic, the bottom number, is so elevated (normal is ) I recommend that you begin taking a blood pressure medicine called hydrochlorothiazide which is a diuretic or "fluid pill".  This medicine helps eliminate extra fluid from your body thereby bringing your pressure down.  Please take 1 every day in the morning.  Follow-up as soon as possible with primary care to discuss results of this medication and possibly continuing blood pressure treatment. I have initiated a request to assist you in finding a primary care provider.  Based on your symptoms, I recommend that we treat you empirically for vaginal yeast infection with Diflucan.  The swab you provided today will also test for bacterial vaginosis, chlamydia, gonorrhea and trichomonas.  If any of these tests are positive we will also treat you for this as well, those results take 3 to 5 days to come back.  The results of your HIV and syphilis tests will be made available to you once they are received.  Because you have a history of iron deficiency, it is also important that you follow-up with a primary care provider to monitor this as it can reoccur throughout your life.

## 2020-12-03 NOTE — ED Triage Notes (Signed)
Pt reports vaginal itching and discharge for a couple days. Reports hx PID and STDs.

## 2020-12-03 NOTE — ED Provider Notes (Signed)
[facility]    CSN: [id number] Arrival date & time: 12/03/20  1022      History   Chief Complaint Chief Complaint  Patient presents with   Vaginal Discharge    HPI Ariel Lutz is a 25 y.o. female.   Patient complains of vaginal discharge over the past week.  Patient states that discharge is been thick, white accompanied by vulvovaginal pruritus.  Patient states she is also noticed that when she urinates she has burning in her vaginal area when she has intercourse she has a lot of vaginal pain and burning as well.  Patient denies pelvic pain, pelvic pressure, inguinal pain, fever, aches, chills, nausea, vomiting, diarrhea, breast tenderness.  Of note, patient has significantly elevated blood pressure on arrival today.  When blood pressure history is reviewed, this has been trending up over the past several months.  Patient has not currently been evaluated for high blood pressure nor has she been treated.  Patient denies chest pain, shortness of breath, headache, fatigue.  EMR reviewed, patient also history of iron deficiency with significant anemia, this was last checked August 2021, patient was prescribed iron replacement tablets at that time, patient states she is no longer taking these.  Patient denies heavy menses or increased frequency or duration of menstruation.  The history is provided by the patient.   Past Medical History:  Diagnosis Date   Asthma    Ovarian cyst     Patient Active Problem List   Diagnosis Date Noted   Pelvic pain 10/24/2019   Chlamydia 10/24/2019   PID (acute pelvic inflammatory disease) 10/22/2019   Pelvic inflammatory disease (PID) 10/22/2019    Past Surgical History:  Procedure Laterality Date   TUBAL LIGATION Right    To fix an ovarian cyst    OB History     Gravida  0   Para  0   Term  0   Preterm  0   AB  0   Living  0      SAB  0   IAB  0   Ectopic  0   Multiple  0   Live Births  0             Home Medications    Prior to Admission medications   Medication Sig Start Date End Date Taking? Authorizing Provider  hydrochlorothiazide (HYDRODIURIL) 25 MG tablet Take 1 tablet (25 mg total) by mouth daily. 12/03/20 03/03/21 Yes Theadora Rama Scales, PA-C  norgestimate-ethinyl estradiol (ORTHO-CYCLEN) 0.25-35 MG-MCG tablet Take 1 tablet by mouth daily. 01/30/20   Conan Bowens, MD    Family History No family history on file.  Social History Social History   Tobacco Use   Smoking status: Never   Smokeless tobacco: Never  Vaping Use   Vaping Use: Never used  Substance Use Topics   Alcohol use: No   Drug use: No     Allergies   Patient has no known allergies.   Review of Systems Review of Systems Pertinent findings noted in history of present illness.    Physical Exam Triage Vital Signs ED Triage Vitals  Enc Vitals Group     BP 12/03/20 1128 (!) 172/96     Pulse Rate 12/03/20 1128 89     Resp 12/03/20 1128 18     Temp 12/03/20 1128 98.2 F (36.8 C)     Temp Source 12/03/20 1128 Oral     SpO2 12/03/20 1128 98 %  Weight --      Height --      Head Circumference --      Peak Flow --      Pain Score 12/03/20 1126 0     Pain Loc --      Pain Edu? --      Excl. in GC? --    No data found.  Updated Vital Signs BP (!) 172/96 (BP Location: Right Arm)   Pulse 89   Temp 98.2 F (36.8 C) (Oral)   Resp 18   LMP 11/25/2020   SpO2 98%   Visual Acuity Right Eye Distance:   Left Eye Distance:   Bilateral Distance:    Right Eye Near:   Left Eye Near:    Bilateral Near:     Physical Exam Vitals and nursing note reviewed.  Constitutional:      Appearance: Normal appearance.  HENT:     Head: Normocephalic and atraumatic.  Cardiovascular:     Rate and Rhythm: Normal rate and regular rhythm.     Pulses: Normal pulses.     Heart sounds: Normal heart sounds.  Pulmonary:     Effort: Pulmonary effort is normal.     Breath sounds: Normal breath  sounds.  Abdominal:     General: Abdomen is flat. Bowel sounds are normal.     Palpations: Abdomen is soft.  Genitourinary:    Comments: Pt politely declines GU exam, pt did provide a swab for testing.  Musculoskeletal:        General: Normal range of motion.     Cervical back: Normal range of motion and neck supple.  Skin:    General: Skin is warm and dry.  Neurological:     General: No focal deficit present.     Mental Status: She is alert and oriented to person, place, and time. Mental status is at baseline.  Psychiatric:        Mood and Affect: Mood normal.        Behavior: Behavior normal.     UC Treatments / Results  Labs (all labs ordered are listed, but only abnormal results are displayed) Labs Reviewed  POCT URINALYSIS DIPSTICK, ED / UC - Abnormal; Notable for the following components:      Result Value   Hgb urine dipstick LARGE (*)    All other components within normal limits  RPR  HIV ANTIBODY (ROUTINE TESTING W REFLEX)  POC URINE PREG, ED  CERVICOVAGINAL ANCILLARY ONLY    EKG   Radiology No results found.  Procedures Procedures (including critical care time)  Medications Ordered in UC Medications - No data to display  Initial Impression / Assessment and Plan / UC Course  I have reviewed the triage vital signs and the nursing notes.  Pertinent labs & imaging results that were available during my care of the patient were reviewed by me and considered in my medical decision making (see chart for details).     Patient was encouraged to obtain a primary care provider soon as possible, I have initiated a request to assist her with this.  Patient advised that she needs to follow-up with her primary care provider regarding her iron deficiency anemia and her hypertension.  Will treat patient empirically for vaginal Candida based on provided today.  Vaginal swab will be sent for testing to rule out chlamydia gonorrhea trichomonas and also evaluate for BV and  to confirm these.  If any other results are positive, she will be treated  for those as well.  Analysis today revealed red blood cells but was otherwise unremarkable, I believe this to be largely due to vaginal irritation from presumed yeast infection.  Patient verbalized understanding and agreement of plan as discussed.  All questions were addressed during visit.  Final Clinical Impressions(s) / UC Diagnoses   Final diagnoses:  Essential hypertension, benign  Vaginal itching  Vaginal discharge  Acute vaginitis  Dysuria  Iron deficiency     Discharge Instructions      Your blood pressure is very elevated today.  Because your diastolic, the bottom number, is so elevated (normal is ) I recommend that you begin taking a blood pressure medicine called hydrochlorothiazide which is a diuretic or "fluid pill".  This medicine helps eliminate extra fluid from your body thereby bringing your pressure down.  Please take 1 every day in the morning.  Follow-up as soon as possible with primary care to discuss results of this medication and possibly continuing blood pressure treatment. I have initiated a request to assist you in finding a primary care provider.  Based on your symptoms, I recommend that we treat you empirically for vaginal yeast infection with Diflucan.  The swab you provided today will also test for bacterial vaginosis, chlamydia, gonorrhea and trichomonas.  If any of these tests are positive we will also treat you for this as well, those results take 3 to 5 days to come back.  The results of your HIV and syphilis tests will be made available to you once they are received.  Because you have a history of iron deficiency, it is also important that you follow-up with a primary care provider to monitor this as it can reoccur throughout your life.     ED Prescriptions     Medication Sig Dispense Auth. Provider   hydrochlorothiazide (HYDRODIURIL) 25 MG tablet Take 1 tablet (25 mg  total) by mouth daily. 90 tablet Theadora Rama Scales, [place]      PDMP not reviewed this encounter.   Theadora Rama Scales, PA-C 12/03/20 1245

## 2020-12-04 LAB — RPR: RPR Ser Ql: NONREACTIVE

## 2020-12-09 ENCOUNTER — Other Ambulatory Visit: Payer: Self-pay

## 2020-12-09 ENCOUNTER — Telehealth (HOSPITAL_COMMUNITY): Payer: Self-pay | Admitting: Emergency Medicine

## 2020-12-09 LAB — CERVICOVAGINAL ANCILLARY ONLY
Bacterial Vaginitis (gardnerella): POSITIVE — AB
Candida Glabrata: NEGATIVE
Candida Vaginitis: POSITIVE — AB
Chlamydia: NEGATIVE
Comment: NEGATIVE
Comment: NEGATIVE
Comment: NEGATIVE
Comment: NEGATIVE
Comment: NEGATIVE
Comment: NORMAL
Neisseria Gonorrhea: NEGATIVE
Trichomonas: NEGATIVE

## 2020-12-09 MED ORDER — FLUCONAZOLE 150 MG PO TABS
150.0000 mg | ORAL_TABLET | Freq: Once | ORAL | 0 refills | Status: AC
  Filled 2020-12-09: qty 2, 4d supply, fill #0

## 2020-12-09 MED ORDER — METRONIDAZOLE 500 MG PO TABS
500.0000 mg | ORAL_TABLET | Freq: Two times a day (BID) | ORAL | 0 refills | Status: AC
  Filled 2020-12-09: qty 14, 7d supply, fill #0

## 2022-02-07 IMAGING — CT CT ABD-PELV W/ CM
2 of 4 series · 14 of 46 positions shown, 16 images · IV contrast (omnipaque)
Comparison: 02/14/2015
COMPARISON: 02/14/2015

Addendum:
CLINICAL DATA: Right lower quadrant abdominal pain

EXAM:
CT ABDOMEN AND PELVIS WITH CONTRAST
TECHNIQUE: Multidetector CT imaging of the abdomen and pelvis was performed
using the standard protocol following bolus administration of
intravenous contrast.
CONTRAST:  100mL OMNIPAQUE IOHEXOL 300 MG/ML  SOLN

[Series 2: axial st · axial · 0.80mm/px · z∈[-346,+68]mm · 11 of 93 slices shown, 13 images]
[im 5/93  soft-tissue]
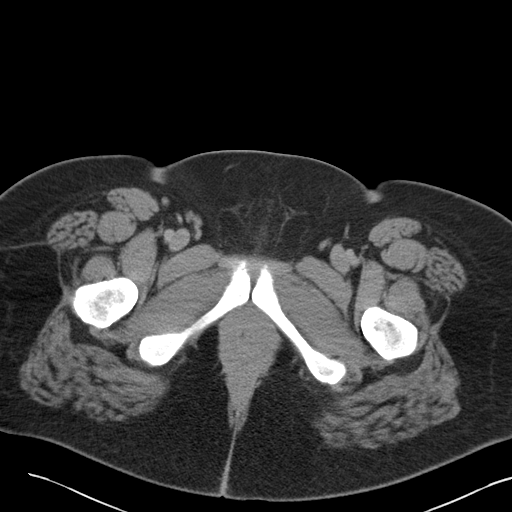
[im 5/93  bone]
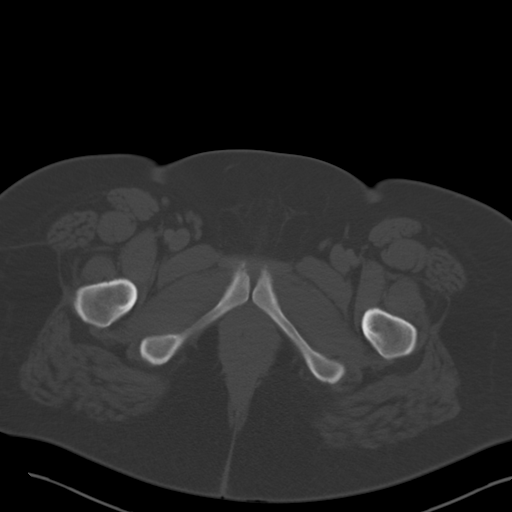
[im 14/93  soft-tissue]
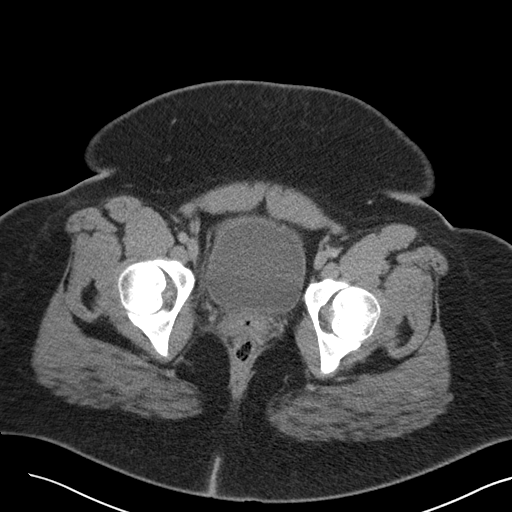
[im 24/93  soft-tissue]
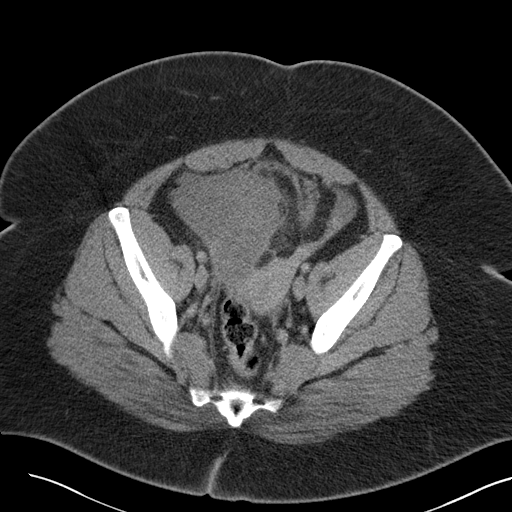
[im 33/93  soft-tissue]
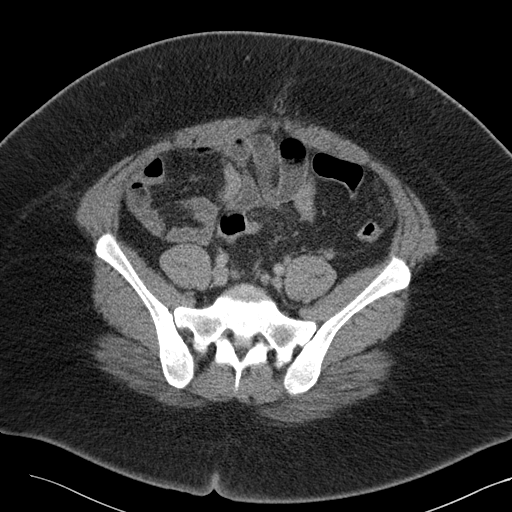
[im 37/93  soft-tissue]
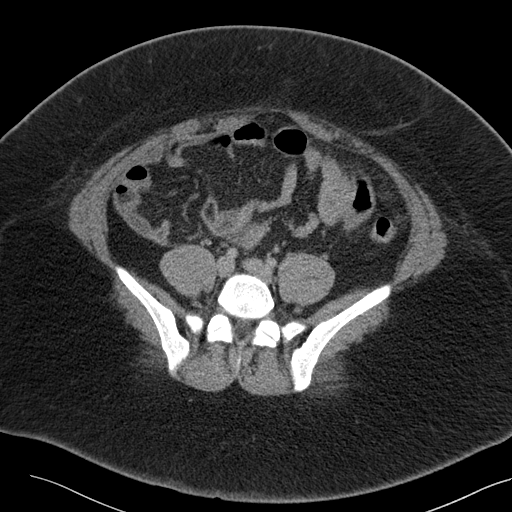
[im 47/93  soft-tissue]
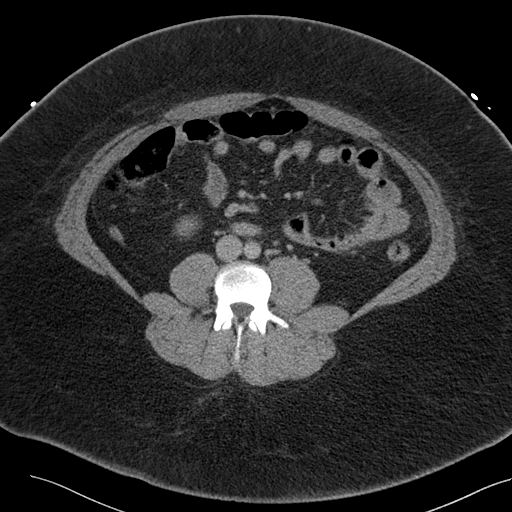
[im 56/93  soft-tissue]
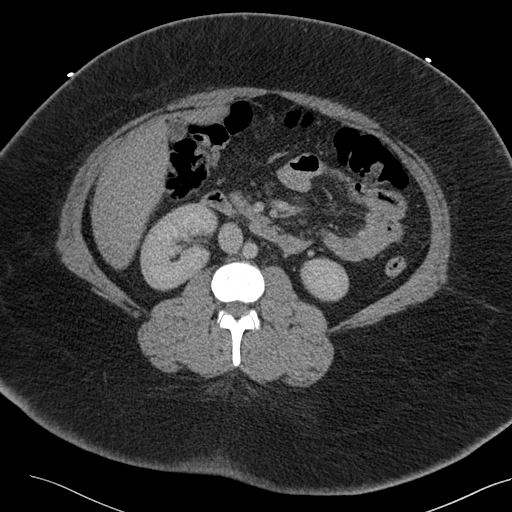
[im 60/93  soft-tissue]
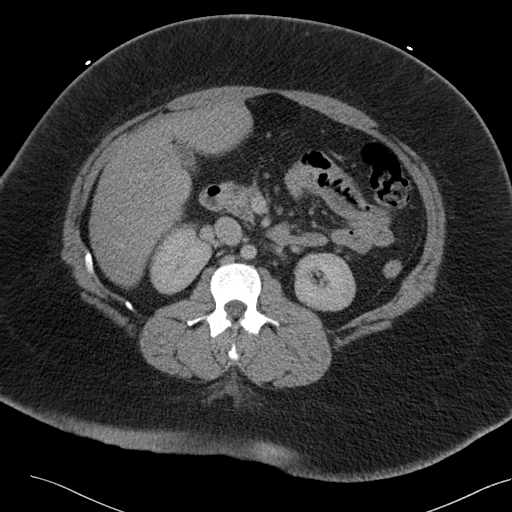
[im 70/93  soft-tissue]
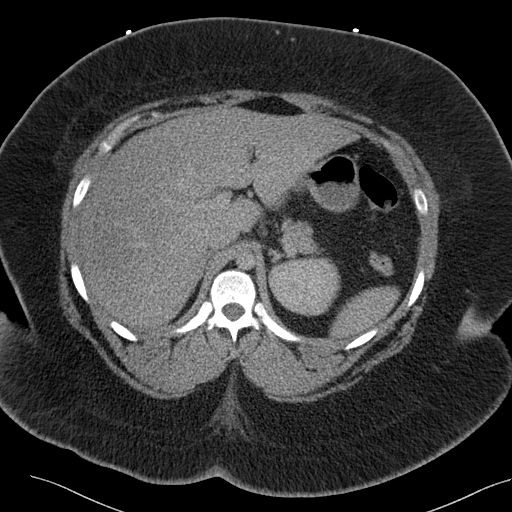
[im 70/93  bone]
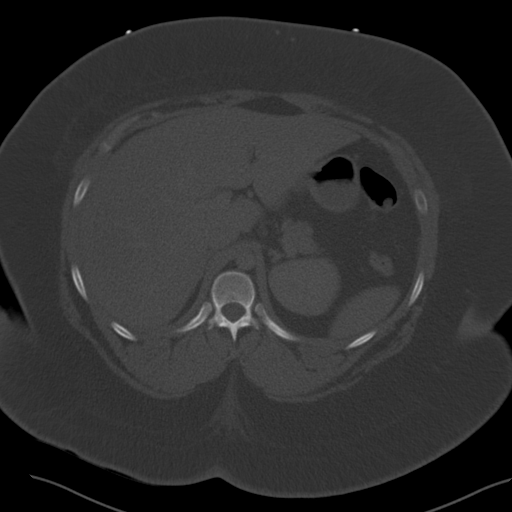
[im 79/93  soft-tissue]
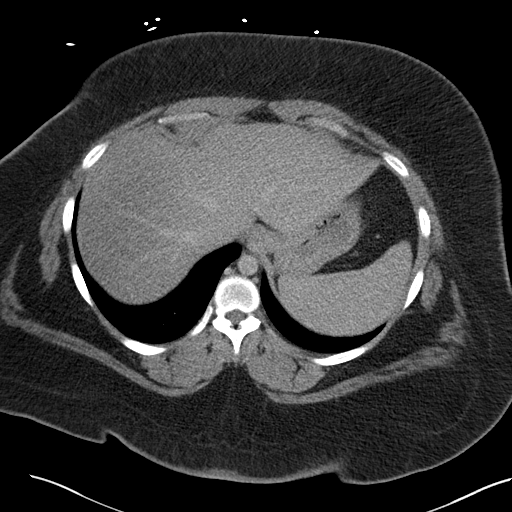
[im 88/93  soft-tissue]
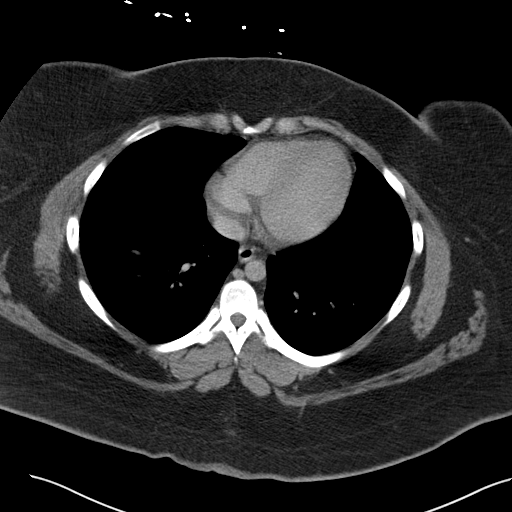

[Series 5: coronal st · coronal · 0.87mm/px · 3 of 184 slices shown]
[im 62/184  soft-tissue]
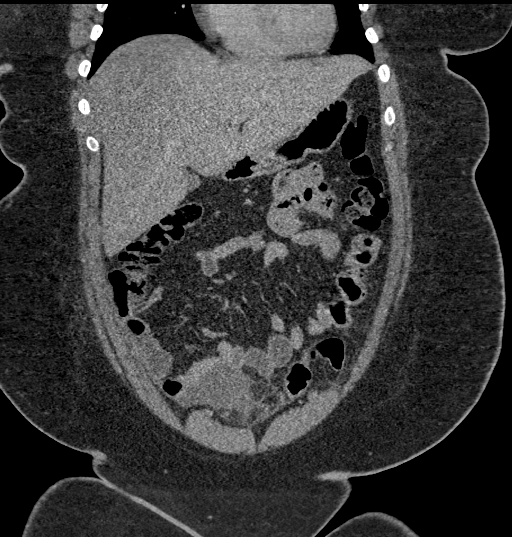
[im 82/184  soft-tissue]
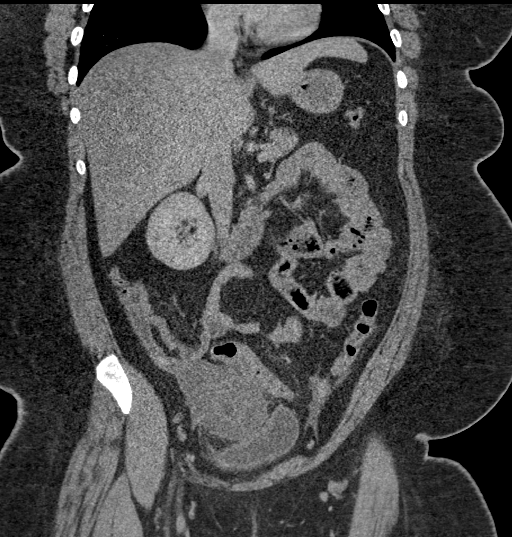
[im 102/184  soft-tissue]
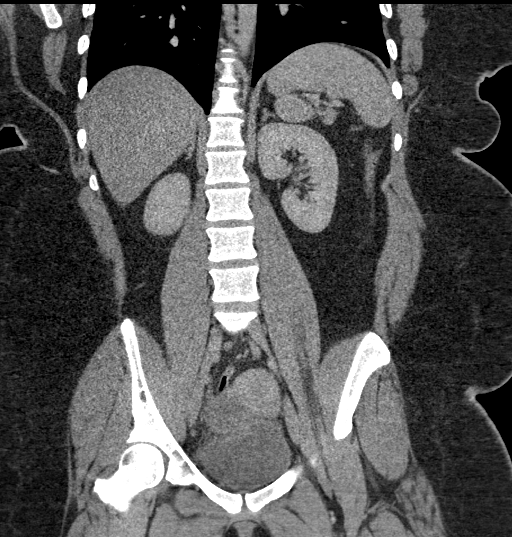

[14 of 46 positions shown; findings below may reference images not displayed]

FINDINGS: Lower chest: The visualized lung bases are clear bilaterally. The
visualized heart and pericardium are unremarkable peer

Hepatobiliary: Mild hepatic steatosis. Gallbladder unremarkable. No
intra or extrahepatic biliary ductal dilation.

Pancreas: Unremarkable

Spleen: Unremarkable

Adrenals/Urinary Tract: The adrenal glands are unremarkable. The
kidneys are normal. Right adnexal mass results in mass effect upon
the bladder, however, the bladder is otherwise unremarkable.

Stomach/Bowel: Stomach, small bowel, and large bowel are
unremarkable. The appendix is seen within the retrocecal region and
is normal. No free intraperitoneal gas or fluid.

Vascular/Lymphatic: The abdominal vasculature is normal. No
pathologic adenopathy within the abdomen and pelvis.

Reproductive: The right ovary is markedly enlarged measuring at
least 5.8 by 9.0 x 9.3 cm in greatest dimension on coronal image #
76 and axial image # 70. There is infiltrative change within the
adjacent adnexal soft tissues. Together, the findings could reflect
changes of ovarian torsion, a a ovarian mass, or a tubo-ovarian
abscess. This is not optimally assessed on this examination. The
uterus and left adnexa are unremarkable.

Other: The rectum is unremarkable peer

Musculoskeletal: Osseous structures are unremarkable.
IMPRESSION: 1. The right ovary is markedly enlarged measuring at least 5.8 x
x 9.3 cm in greatest dimension. There is infiltrative change within
the adjacent adnexal soft tissues. Together, the findings could
reflect changes of ovarian torsion, a primary ovarian mass, or a
tubo-ovarian abscess. This is not optimally assessed on this
examination. Further evaluation with transabdominal and transvaginal
pelvic ultrasound is recommended.
2. Normal appendix.
3. Mild hepatic steatosis.

ADDENDUM:
These results were called by telephone at the time of interpretation
on 10/22/2019 at [DATE] to provider ABDEALLI BEH , who verbally
acknowledged these results.

*** End of Addendum ***
FINDINGS: Lower chest: The visualized lung bases are clear bilaterally. The
visualized heart and pericardium are unremarkable peer

Hepatobiliary: Mild hepatic steatosis. Gallbladder unremarkable. No
intra or extrahepatic biliary ductal dilation.

Pancreas: Unremarkable

Spleen: Unremarkable

Adrenals/Urinary Tract: The adrenal glands are unremarkable. The
kidneys are normal. Right adnexal mass results in mass effect upon
the bladder, however, the bladder is otherwise unremarkable.

Stomach/Bowel: Stomach, small bowel, and large bowel are
unremarkable. The appendix is seen within the retrocecal region and
is normal. No free intraperitoneal gas or fluid.

Vascular/Lymphatic: The abdominal vasculature is normal. No
pathologic adenopathy within the abdomen and pelvis.

Reproductive: The right ovary is markedly enlarged measuring at
least 5.8 by 9.0 x 9.3 cm in greatest dimension on coronal image #
76 and axial image # 70. There is infiltrative change within the
adjacent adnexal soft tissues. Together, the findings could reflect
changes of ovarian torsion, a a ovarian mass, or a tubo-ovarian
abscess. This is not optimally assessed on this examination. The
uterus and left adnexa are unremarkable.

Other: The rectum is unremarkable peer

Musculoskeletal: Osseous structures are unremarkable.
IMPRESSION: 1. The right ovary is markedly enlarged measuring at least 5.8 x
x 9.3 cm in greatest dimension. There is infiltrative change within
the adjacent adnexal soft tissues. Together, the findings could
reflect changes of ovarian torsion, a primary ovarian mass, or a
tubo-ovarian abscess. This is not optimally assessed on this
examination. Further evaluation with transabdominal and transvaginal
pelvic ultrasound is recommended.
2. Normal appendix.
3. Mild hepatic steatosis.
